# Patient Record
Sex: Male | Born: 1985
Health system: Southern US, Community
[De-identification: ages and names within clinical notes are randomized; demographics above are authoritative.]

## PROBLEM LIST (undated history)

## (undated) HISTORY — PX: OTHER SURGICAL HISTORY: SHX169

---

## 2008-06-03 ENCOUNTER — Emergency Department (HOSPITAL_COMMUNITY): Admission: EM | Admit: 2008-06-03 | Discharge: 2008-06-03 | Payer: Self-pay | Admitting: Emergency Medicine

## 2015-10-24 DIAGNOSIS — Z6830 Body mass index (BMI) 30.0-30.9, adult: Secondary | ICD-10-CM | POA: Diagnosis not present

## 2015-10-24 DIAGNOSIS — E669 Obesity, unspecified: Secondary | ICD-10-CM | POA: Diagnosis not present

## 2015-10-24 DIAGNOSIS — E7889 Other lipoprotein metabolism disorders: Secondary | ICD-10-CM | POA: Diagnosis not present

## 2016-01-02 DIAGNOSIS — R110 Nausea: Secondary | ICD-10-CM | POA: Diagnosis not present

## 2016-01-02 DIAGNOSIS — R51 Headache: Secondary | ICD-10-CM | POA: Diagnosis not present

## 2016-01-02 DIAGNOSIS — S70361A Insect bite (nonvenomous), right thigh, initial encounter: Secondary | ICD-10-CM | POA: Diagnosis not present

## 2016-01-02 DIAGNOSIS — R42 Dizziness and giddiness: Secondary | ICD-10-CM | POA: Diagnosis not present

## 2016-01-02 DIAGNOSIS — R739 Hyperglycemia, unspecified: Secondary | ICD-10-CM | POA: Diagnosis not present

## 2016-01-02 DIAGNOSIS — Z6830 Body mass index (BMI) 30.0-30.9, adult: Secondary | ICD-10-CM | POA: Diagnosis not present

## 2016-01-02 DIAGNOSIS — R509 Fever, unspecified: Secondary | ICD-10-CM | POA: Diagnosis not present

## 2016-01-23 DIAGNOSIS — Z6830 Body mass index (BMI) 30.0-30.9, adult: Secondary | ICD-10-CM | POA: Diagnosis not present

## 2016-01-23 DIAGNOSIS — E669 Obesity, unspecified: Secondary | ICD-10-CM | POA: Diagnosis not present

## 2016-01-23 DIAGNOSIS — Z1389 Encounter for screening for other disorder: Secondary | ICD-10-CM | POA: Diagnosis not present

## 2016-01-23 DIAGNOSIS — E7889 Other lipoprotein metabolism disorders: Secondary | ICD-10-CM | POA: Diagnosis not present

## 2016-01-23 DIAGNOSIS — R197 Diarrhea, unspecified: Secondary | ICD-10-CM | POA: Diagnosis not present

## 2016-03-30 DIAGNOSIS — R1033 Periumbilical pain: Secondary | ICD-10-CM | POA: Diagnosis not present

## 2016-03-30 DIAGNOSIS — R197 Diarrhea, unspecified: Secondary | ICD-10-CM | POA: Diagnosis not present

## 2016-03-30 DIAGNOSIS — Z6830 Body mass index (BMI) 30.0-30.9, adult: Secondary | ICD-10-CM | POA: Diagnosis not present

## 2016-04-02 DIAGNOSIS — R197 Diarrhea, unspecified: Secondary | ICD-10-CM | POA: Diagnosis not present

## 2016-04-02 DIAGNOSIS — R1033 Periumbilical pain: Secondary | ICD-10-CM | POA: Diagnosis not present

## 2016-10-16 DIAGNOSIS — R109 Unspecified abdominal pain: Secondary | ICD-10-CM | POA: Diagnosis not present

## 2016-10-16 DIAGNOSIS — K529 Noninfective gastroenteritis and colitis, unspecified: Secondary | ICD-10-CM | POA: Diagnosis not present

## 2016-10-18 DIAGNOSIS — E669 Obesity, unspecified: Secondary | ICD-10-CM | POA: Diagnosis not present

## 2016-10-18 DIAGNOSIS — K529 Noninfective gastroenteritis and colitis, unspecified: Secondary | ICD-10-CM | POA: Diagnosis not present

## 2016-10-18 DIAGNOSIS — Z6831 Body mass index (BMI) 31.0-31.9, adult: Secondary | ICD-10-CM | POA: Diagnosis not present

## 2016-10-18 DIAGNOSIS — Z7689 Persons encountering health services in other specified circumstances: Secondary | ICD-10-CM | POA: Diagnosis not present

## 2017-01-29 DIAGNOSIS — H179 Unspecified corneal scar and opacity: Secondary | ICD-10-CM | POA: Diagnosis not present

## 2017-02-13 DIAGNOSIS — H179 Unspecified corneal scar and opacity: Secondary | ICD-10-CM | POA: Diagnosis not present

## 2017-03-05 DIAGNOSIS — H219 Unspecified disorder of iris and ciliary body: Secondary | ICD-10-CM | POA: Diagnosis not present

## 2017-03-05 DIAGNOSIS — R69 Illness, unspecified: Secondary | ICD-10-CM | POA: Diagnosis not present

## 2017-03-05 DIAGNOSIS — Z0000 Encounter for general adult medical examination without abnormal findings: Secondary | ICD-10-CM | POA: Diagnosis not present

## 2017-03-05 DIAGNOSIS — Z6828 Body mass index (BMI) 28.0-28.9, adult: Secondary | ICD-10-CM | POA: Diagnosis not present

## 2017-06-11 DIAGNOSIS — R1032 Left lower quadrant pain: Secondary | ICD-10-CM | POA: Diagnosis not present

## 2017-06-11 DIAGNOSIS — E669 Obesity, unspecified: Secondary | ICD-10-CM | POA: Diagnosis not present

## 2017-06-11 DIAGNOSIS — E7889 Other lipoprotein metabolism disorders: Secondary | ICD-10-CM | POA: Diagnosis not present

## 2017-06-19 DIAGNOSIS — H179 Unspecified corneal scar and opacity: Secondary | ICD-10-CM | POA: Diagnosis not present

## 2017-10-17 DIAGNOSIS — H179 Unspecified corneal scar and opacity: Secondary | ICD-10-CM | POA: Diagnosis not present

## 2017-12-12 DIAGNOSIS — G43909 Migraine, unspecified, not intractable, without status migrainosus: Secondary | ICD-10-CM | POA: Diagnosis not present

## 2017-12-12 DIAGNOSIS — R209 Unspecified disturbances of skin sensation: Secondary | ICD-10-CM | POA: Diagnosis not present

## 2017-12-12 DIAGNOSIS — E669 Obesity, unspecified: Secondary | ICD-10-CM | POA: Diagnosis not present

## 2017-12-23 DIAGNOSIS — G43909 Migraine, unspecified, not intractable, without status migrainosus: Secondary | ICD-10-CM | POA: Diagnosis not present

## 2017-12-23 DIAGNOSIS — R202 Paresthesia of skin: Secondary | ICD-10-CM | POA: Diagnosis not present

## 2017-12-30 ENCOUNTER — Emergency Department (HOSPITAL_COMMUNITY)
Admission: EM | Admit: 2017-12-30 | Discharge: 2017-12-30 | Disposition: A | Discharge status: Home / Self Care | Payer: Medicare HMO | Attending: Emergency Medicine | Admitting: Emergency Medicine

## 2017-12-30 ENCOUNTER — Encounter (HOSPITAL_COMMUNITY): Payer: Self-pay | Admitting: Emergency Medicine

## 2017-12-30 ENCOUNTER — Other Ambulatory Visit: Payer: Self-pay

## 2017-12-30 ENCOUNTER — Emergency Department (HOSPITAL_COMMUNITY): Payer: Medicare HMO

## 2017-12-30 DIAGNOSIS — R51 Headache: Secondary | ICD-10-CM | POA: Diagnosis not present

## 2017-12-30 DIAGNOSIS — R519 Headache, unspecified: Secondary | ICD-10-CM

## 2017-12-30 MED ORDER — DEXAMETHASONE 4 MG PO TABS
10.0000 mg | ORAL_TABLET | Freq: Once | ORAL | Status: AC
  Administered 2017-12-30: 10 mg via ORAL
  Filled 2017-12-30: qty 3

## 2017-12-30 MED ORDER — DIPHENHYDRAMINE HCL 50 MG/ML IJ SOLN
50.0000 mg | Freq: Once | INTRAMUSCULAR | Status: AC
  Administered 2017-12-30: 50 mg via INTRAMUSCULAR
  Filled 2017-12-30: qty 1

## 2017-12-30 MED ORDER — PROCHLORPERAZINE EDISYLATE 10 MG/2ML IJ SOLN
10.0000 mg | Freq: Once | INTRAMUSCULAR | Status: AC
  Administered 2017-12-30: 10 mg via INTRAMUSCULAR
  Filled 2017-12-30: qty 2

## 2017-12-30 NOTE — ED Notes (Signed)
E-signature not available, pt verbalized understanding of DC instructions  

## 2017-12-30 NOTE — ED Triage Notes (Addendum)
Pt states headaches for 3 weeks that change location,. Pt also states he has muscle pains to his legs and arms. Pt had an MRI last Monday and it did not show anything new.

## 2017-12-30 NOTE — ED Provider Notes (Signed)
Patient placed in Quick Look pathway, seen and evaluated   Chief Complaint: headache  HPI:   Brian Huber is a 32 y.o. male who presents to the ED with a headache that started 3 weeks ago. The headache is located on both sides of the head and back of the head. Patient reports having headaches 2 years ago and had an MRI and they told them it may be TIA's. Patient was adopted at age 15 and family hx is not known.   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  Physical Exam:  BP (!) 114/97 (BP Location: Right Arm)   Pulse 79   Temp 98.4 F (36.9 C) (Oral)   Resp 14   SpO2 100%    Gen: No distress  Neuro: grips are equal no pronator drift  Skin: Warm and dry    Initiation of care has begun. The patient has been counseled on the process, plan, and necessity for staying for the completion/evaluation, and the remainder of the medical screening examination    Ashley Murrain, NP 12/30/17 [place], [place], DO 12/30/17 [id number]

## 2017-12-30 NOTE — ED Provider Notes (Signed)
[facility] EMERGENCY DEPARTMENT Provider Note   CSN: [id number] Arrival date & time: 12/30/17  [place]     History   Chief Complaint Chief Complaint  Patient presents with  . Headache    HPI Brian Huber is a 32 y.o. male.  32 yo M with a chief complaint of a headache.  This is diffusely about the head going on for the past 3 weeks.  Nothing seems to make it better or worse.  Denies trauma.  He states that his legs feel heavy from time to time and sometimes his left arm feels heavy.  He denies numbness or weakness.  Denies neck pain or back pain.  The history is provided by the patient.  Headache   This is a new problem. The current episode started yesterday. The problem occurs constantly. The problem has not changed since onset.The headache is associated with nothing. Pain location: whole head. The quality of the pain is described as dull. The pain is at a severity of 7/10. The pain is moderate. Pertinent negatives include no fever, no palpitations, no shortness of breath and no vomiting. He has tried nothing for the symptoms. The treatment provided no relief.    History reviewed. No pertinent past medical history.  There are no active problems to display for this patient.   Past Surgical History:  Procedure Laterality Date  . ankle injury          Home Medications    Prior to Admission medications   Not on File    Family History No family history on file.  Social History Social History   Tobacco Use  . Smoking status: Not on file  Substance Use Topics  . Alcohol use: Not on file  . Drug use: Not on file     Allergies   Patient has no known allergies.   Review of Systems Review of Systems  Constitutional: Negative for chills and fever.  HENT: Negative for congestion and facial swelling.   Eyes: Negative for discharge and visual disturbance.  Respiratory: Negative for shortness of breath.   Cardiovascular: Negative for chest pain  and palpitations.  Gastrointestinal: Negative for abdominal pain, diarrhea and vomiting.  Musculoskeletal: Negative for arthralgias and myalgias.  Skin: Negative for color change and rash.  Neurological: Positive for headaches. Negative for tremors and syncope.  Psychiatric/Behavioral: Negative for confusion and dysphoric mood.     Physical Exam Updated Vital Signs BP 110/76   Pulse 64   Temp 98.4 F (36.9 C) (Oral)   Resp 14   SpO2 98%   Physical Exam  Constitutional: He is oriented to person, place, and time. He appears well-developed and well-nourished.  HENT:  Head: Normocephalic and atraumatic.  Eyes: Pupils are equal, round, and reactive to light. EOM are normal.  Neck: Normal range of motion. Neck supple. No JVD present.  Cardiovascular: Normal rate and regular rhythm. Exam reveals no gallop and no friction rub.  No murmur heard. Pulmonary/Chest: No respiratory distress. He has no wheezes.  Abdominal: He exhibits no distension. There is no rebound and no guarding.  Musculoskeletal: Normal range of motion.  Neurological: He is alert and oriented to person, place, and time. He has normal strength. He displays no atrophy. No cranial nerve deficit or sensory deficit. He displays a negative Romberg sign. Coordination and gait normal.  Skin: No rash noted. No pallor.  Psychiatric: He has a normal mood and affect. His behavior is normal.  Nursing note and vitals reviewed.  ED Treatments / Results  Labs (all labs ordered are listed, but only abnormal results are displayed) Labs Reviewed - No data to display  EKG None  Radiology Ct Head Wo Contrast  Result Date: 12/30/2017 CLINICAL DATA:  Headache for the last 3 weeks, progressively worsening. Denies injury. EXAM: CT HEAD WITHOUT CONTRAST TECHNIQUE: Contiguous axial images were obtained from the base of the skull through the vertex without intravenous contrast. COMPARISON:  Brain MRI dated 12/23/2017. FINDINGS: Brain:  Ventricles are normal in size and configuration. All areas of the brain demonstrate appropriate gray-white matter differentiation. There is no mass, hemorrhage, edema or other evidence of acute parenchymal abnormality. No extra-axial hemorrhage. Vascular: No hyperdense vessel or unexpected calcification. Skull: Normal. Negative for fracture or focal lesion. Sinuses/Orbits: No acute finding. Other: None. IMPRESSION: Normal head CT.  No intracranial mass, hemorrhage or edema. Electronically Signed   By: Franki Cabot M.D.   On: 12/30/2017 18:47    Procedures Procedures (including critical care time)  Medications Ordered in ED Medications  prochlorperazine (COMPAZINE) injection 10 mg (10 mg Intramuscular Given 12/30/17 2038)  diphenhydrAMINE (BENADRYL) injection 50 mg (50 mg Intramuscular Given 12/30/17 2038)  dexamethasone (DECADRON) tablet 10 mg (10 mg Oral Given 12/30/17 2037)     Initial Impression / Assessment and Plan / ED Course  I have reviewed the triage vital signs and the nursing notes.  Pertinent labs & imaging results that were available during my care of the patient were reviewed by me and considered in my medical decision making (see chart for details).     32 yo M with a chief complaint of a headache.  Benign neurologic exam.  Patient apparently has had 2 MRIs in the past couple weeks.  Family is unsure of the results.  CT here is negative.  Symptoms improved significantly with a headache cocktail.  We will have him follow-up with neurology.  Was able to access the MRIs through PACS, he has some chronic appearing white matter changes that they think are due to prior injury.  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  11:42 PM:  I have discussed the diagnosis/risks/treatment options with the patient and family and believe the pt to be eligible for discharge home to follow-up with Neuro . We also discussed returning to the ED  immediately if new or worsening sx occur. We discussed the sx which are most concerning (e.g., worsening headache, new weakness ) that necessitate immediate return. Medications administered to the patient during their visit and any new prescriptions provided to the patient are listed below.  Medications given during this visit Medications  prochlorperazine (COMPAZINE) injection 10 mg (10 mg Intramuscular Given 12/30/17 2038)  diphenhydrAMINE (BENADRYL) injection 50 mg (50 mg Intramuscular Given 12/30/17 2038)  dexamethasone (DECADRON) tablet 10 mg (10 mg Oral Given 12/30/17 2037)      The patient appears reasonably screen and/or stabilized for discharge and I doubt any other medical condition or other [facility] requiring further screening, evaluation, or treatment in the ED at this time prior to discharge.    Final Clinical Impressions(s) / ED Diagnoses   Final diagnoses:  Bad headache    ED Discharge Orders        Ordered    Ambulatory referral to Neurology    Comments:  New headache syndrome   12/30/17 [place], [place], DO 12/30/17 [id number]

## 2017-12-31 ENCOUNTER — Encounter: Payer: Self-pay | Admitting: Neurology

## 2018-02-17 DIAGNOSIS — H179 Unspecified corneal scar and opacity: Secondary | ICD-10-CM | POA: Diagnosis not present

## 2018-03-05 NOTE — Progress Notes (Signed)
NEUROLOGY CONSULTATION NOTE  Brian Huber MRN: [id number] DOB: 1985/12/07  Referring provider: Deno Etienne, DO (ED referral) Primary care provider: Jeanie Sewer, NP  Reason for consult:  headache  HISTORY OF PRESENT ILLNESS: Brian Huber is a 32 year old right-handed male who presents for headache.  He is accompanied by his father who supplements history.  Onset:  2017.  An MRI of brain without contrast from 01/02/16 was personally reviewed and demonstrated small white matter signal abnormality in the right posterior frontal region, suspicious for either old infarct or prior trauma.   He was started on ASA 81mg  daily.  Headaches improved.   In June 2019, headaches returned.  He had a repeat MRI of brain with and without contrast on 12/18/17, which was personally reviewed and still demonstrated the small focus in the right frontal region, unchanged. Location:  diffuse Quality:  Pounding, squeezing Intensity:  Severe.  He denies new headache Aura:  no Prodrome:  no Postdrome:  no Associated symptoms:  phonophobia.  He denies associated nausea, vomiting, photophobia, autonomic symptoms, unilateral numbness or weakness. Duration:  1 day Frequency:  daily Frequency of abortive medication: no Exacerbating factors:  no Relieving factors:  no Activity:  No  At the same time, he reports onset of shaking.  It is a sensation of internal shaking involving the entire body or a particular limb (any).  There is no visible shaking.  If it occurs in his leg, his leg will feel weak.  In addition, he reports feeling nauseous after eating.  Marland Kitchen  He was evaluated in the ED on 12/30/17.  CT of head personally reviewed and was unremarkable.  He was treated with a headache cocktail.  Current NSAIDS:  ASA 81mg  daily (for "mini strokes") Current analgesics:  no Current triptans:  no Current ergotamine:  no Current anti-emetic:  no Current muscle relaxants:  no Current anti-anxiolytic:  no Current  sleep aide:  no Current Antihypertensive medications:  no Current Antidepressant medications:  no Current Anticonvulsant medications:  no Current anti-CGRP:  no Current Vitamins/Herbal/Supplements:  no Current Antihistamines/Decongestants:  no Other therapy:  no Other medication:  no  He reportedly had a repeat MRI of the brain  Past NSAIDS:  no Past analgesics:  Goody powder Past abortive triptans:  no Past abortive ergotamine:  no Past muscle relaxants:  no Past anti-emetic:  no Past antihypertensive medications:  no Past antidepressant medications:  no Past anticonvulsant medications:  Gabapentin (ineffective) Past anti-CGRP:  no Past vitamins/Herbal/Supplements:  no Past antihistamines/decongestants:  no Other past therapies:  no  Caffeine:  Tea and cola, Mt Dew daily Alcohol:  no Smoker:  no Diet:  A lot of water.  Soda daily Exercise:  Physically active Depression:  no; Anxiety:  no Other pain:  no Sleep hygiene:  [place] Prior to 2016, no history of headache.  Denies history of head trauma. Family history of headache:  adopted   PAST MEDICAL HISTORY: No PMH  PAST SURGICAL HISTORY: Past Surgical History:  Procedure Laterality Date  . ankle injury      MEDICATIONS: No current outpatient medications on file prior to visit.   No current facility-administered medications on file prior to visit.     ALLERGIES: No Known Allergies  FAMILY HISTORY: No family history on file.  SOCIAL HISTORY: Social History   Socioeconomic History  . Marital status: Single    Spouse name: Not on file  . Number of children: Not on file  . Years of education: Not  on file  . Highest education level: Not on file  Occupational History  . Not on file  Social Needs  . Financial resource strain: Not on file  . Food insecurity:    Worry: Not on file    Inability: Not on file  . Transportation needs:    Medical: Not on file    Non-medical: Not on file  Tobacco Use  .  Smoking status: Not on file  Substance and Sexual Activity  . Alcohol use: Not on file  . Drug use: Not on file  . Sexual activity: Not on file  Lifestyle  . Physical activity:    Days per week: Not on file    Minutes per session: Not on file  . Stress: Not on file  Relationships  . Social connections:    Talks on phone: Not on file    Gets together: Not on file    Attends religious service: Not on file    Active member of club or organization: Not on file    Attends meetings of clubs or organizations: Not on file    Relationship status: Not on file  . Intimate partner violence:    Fear of current or ex partner: Not on file    Emotionally abused: Not on file    Physically abused: Not on file    Forced sexual activity: Not on file  Other Topics Concern  . Not on file  Social History Narrative  . Not on file    REVIEW OF SYSTEMS: Constitutional: No fevers, chills, or sweats, no generalized fatigue, change in appetite Eyes: No visual changes, double vision, eye pain Ear, nose and throat: No hearing loss, ear pain, nasal congestion, sore throat Cardiovascular: No chest pain, palpitations Respiratory:  No shortness of breath at rest or with exertion, wheezes GastrointestinaI: No nausea, vomiting, diarrhea, abdominal pain, fecal incontinence Genitourinary:  No dysuria, urinary retention or frequency Musculoskeletal:  No neck pain, back pain Integumentary: No rash, pruritus, skin lesions Neurological: as above Psychiatric: No depression, insomnia, anxiety Endocrine: No palpitations, fatigue, diaphoresis, mood swings, change in appetite, change in weight, increased thirst Hematologic/Lymphatic:  No purpura, petechiae. Allergic/Immunologic: no itchy/runny eyes, nasal congestion, recent allergic reactions, rashes  PHYSICAL EXAM: Blood pressure 108/88, pulse 69, height 5' 10.75" (1.797 m), weight 223 lb (101.2 kg), SpO2 99 %. General: No acute distress.  Head:   Normocephalic/atraumatic Eyes:  fundi examined but not visualized Neck: supple, no paraspinal tenderness, full range of motion Back: No paraspinal tenderness Heart: regular rate and rhythm Lungs: Clear to auscultation bilaterally. Vascular: No carotid bruits. Neurological Exam: Mental status: alert and oriented to person, place, and time, recent and remote memory intact, fund of knowledge intact, attention and concentration intact, speech fluent and not dysarthric, language intact. Cranial nerves: CN I: not tested CN II: pupils equal, round and reactive to light, visual fields intact CN III, IV, VI:  full range of motion, no nystagmus, no ptosis CN V: facial sensation intact CN VII: upper and lower face symmetric CN VIII: hearing intact CN IX, X: gag intact, uvula midline CN XI: sternocleidomastoid and trapezius muscles intact CN XII: tongue midline Bulk & Tone: normal, no fasciculations. Motor:  5/5 throughout  Sensation: temperature and vibration sensation intact. Deep Tendon Reflexes:  2+ throughout, toes downgoing.  Finger to nose testing:  Without dysmetria.  Heel to shin:  Without dysmetria.  Gait:  Normal station and stride.  Able to turn and tandem walk. Romberg negative Reports feeling shaking  now.  No tremor or abnormal movement appreciated.Marland Kitchen  IMPRESSION: 1.  Daily headaches, unspecified.  Migraine possible 2.  Abnormal white matter change on brain MRI.  Unclear.  Perhaps remote tiny infarct, trauma (denies history), not likely demyelinating disease (stable compared to prior imaging in 2017). 3.  Internal sensation of shaking.  No shaking appreciated.  I do not suspect seizure.  Not associated with headache.  Likely not neurologic 4.  Nausea after he eats.  Not associated with headache.    PLAN: 1.  Start nortriptyline 10mg  at bedtime.   If headaches not improved in 4 weeks, contact me and we can increase dose. 2.  Take Tylenol or Excedrin Tension for acute headaches but  limit to no more than 2 days out of week to prevent rebound headache 3.  Keep headache diary 4.  Stop drinking caffeine.  Continue drinking plenty of water 5. Follow up in 4 months.  Thank you for allowing me to take part in the care of this patient.  Metta Clines, DO  CC: Jeanie Sewer, NP

## 2018-03-06 ENCOUNTER — Encounter: Payer: Self-pay | Admitting: Neurology

## 2018-03-06 ENCOUNTER — Encounter

## 2018-03-06 ENCOUNTER — Ambulatory Visit (INDEPENDENT_AMBULATORY_CARE_PROVIDER_SITE_OTHER): Payer: Medicare HMO | Admitting: Neurology

## 2018-03-06 VITALS — BP 108/88 | HR 69 | Ht 70.75 in | Wt 223.0 lb

## 2018-03-06 DIAGNOSIS — R110 Nausea: Secondary | ICD-10-CM

## 2018-03-06 DIAGNOSIS — R51 Headache: Secondary | ICD-10-CM | POA: Diagnosis not present

## 2018-03-06 DIAGNOSIS — R519 Headache, unspecified: Secondary | ICD-10-CM

## 2018-03-06 DIAGNOSIS — R251 Tremor, unspecified: Secondary | ICD-10-CM | POA: Diagnosis not present

## 2018-03-06 MED ORDER — NORTRIPTYLINE HCL 10 MG PO CAPS: 10.0000 mg | ORAL_CAPSULE | Freq: Every day | ORAL | 3 refills | Status: DC

## 2018-03-06 NOTE — Patient Instructions (Addendum)
Headaches may be migraine The spot on the brain is unclear.  It may be an old tiny stroke or evidence of old head trauma  1.  Start nortriptyline 10mg  at bedtime.  If headaches not improved in 4 weeks, contact me and we can increase dose. 2.  Take Tylenol or Excedrin Tension for acute headaches but limit to no more than 2 days out of week to prevent rebound headache 3.  Keep headache diary 4.  Stop drinking caffeine.  Continue drinking plenty of water 5. Follow up in 4 months.

## 2018-05-14 DIAGNOSIS — R2681 Unsteadiness on feet: Secondary | ICD-10-CM | POA: Diagnosis not present

## 2018-05-14 DIAGNOSIS — E785 Hyperlipidemia, unspecified: Secondary | ICD-10-CM | POA: Diagnosis not present

## 2018-05-14 DIAGNOSIS — Z1331 Encounter for screening for depression: Secondary | ICD-10-CM | POA: Diagnosis not present

## 2018-05-14 DIAGNOSIS — Z1339 Encounter for screening examination for other mental health and behavioral disorders: Secondary | ICD-10-CM | POA: Diagnosis not present

## 2018-05-14 DIAGNOSIS — R51 Headache: Secondary | ICD-10-CM | POA: Diagnosis not present

## 2018-05-14 DIAGNOSIS — R030 Elevated blood-pressure reading, without diagnosis of hypertension: Secondary | ICD-10-CM | POA: Diagnosis not present

## 2018-05-14 DIAGNOSIS — S70361A Insect bite (nonvenomous), right thigh, initial encounter: Secondary | ICD-10-CM | POA: Diagnosis not present

## 2018-05-14 DIAGNOSIS — R5382 Chronic fatigue, unspecified: Secondary | ICD-10-CM | POA: Diagnosis not present

## 2018-05-27 DIAGNOSIS — R2681 Unsteadiness on feet: Secondary | ICD-10-CM | POA: Diagnosis not present

## 2018-05-27 DIAGNOSIS — R030 Elevated blood-pressure reading, without diagnosis of hypertension: Secondary | ICD-10-CM | POA: Diagnosis not present

## 2018-05-27 DIAGNOSIS — E785 Hyperlipidemia, unspecified: Secondary | ICD-10-CM | POA: Diagnosis not present

## 2018-05-27 DIAGNOSIS — Z6832 Body mass index (BMI) 32.0-32.9, adult: Secondary | ICD-10-CM | POA: Diagnosis not present

## 2018-05-27 DIAGNOSIS — A770 Spotted fever due to Rickettsia rickettsii: Secondary | ICD-10-CM | POA: Diagnosis not present

## 2018-05-27 DIAGNOSIS — R51 Headache: Secondary | ICD-10-CM | POA: Diagnosis not present

## 2018-07-10 DIAGNOSIS — R030 Elevated blood-pressure reading, without diagnosis of hypertension: Secondary | ICD-10-CM | POA: Diagnosis not present

## 2018-07-10 DIAGNOSIS — E785 Hyperlipidemia, unspecified: Secondary | ICD-10-CM | POA: Diagnosis not present

## 2018-07-10 DIAGNOSIS — Z6832 Body mass index (BMI) 32.0-32.9, adult: Secondary | ICD-10-CM | POA: Diagnosis not present

## 2018-07-10 DIAGNOSIS — R2681 Unsteadiness on feet: Secondary | ICD-10-CM | POA: Diagnosis not present

## 2018-07-10 DIAGNOSIS — R51 Headache: Secondary | ICD-10-CM | POA: Diagnosis not present

## 2018-07-17 ENCOUNTER — Ambulatory Visit: Payer: Medicare HMO | Admitting: Neurology

## 2018-07-29 DIAGNOSIS — R030 Elevated blood-pressure reading, without diagnosis of hypertension: Secondary | ICD-10-CM | POA: Diagnosis not present

## 2018-07-29 DIAGNOSIS — R945 Abnormal results of liver function studies: Secondary | ICD-10-CM | POA: Diagnosis not present

## 2018-07-29 DIAGNOSIS — R2681 Unsteadiness on feet: Secondary | ICD-10-CM | POA: Diagnosis not present

## 2018-07-29 DIAGNOSIS — E669 Obesity, unspecified: Secondary | ICD-10-CM | POA: Diagnosis not present

## 2018-07-29 DIAGNOSIS — Z6833 Body mass index (BMI) 33.0-33.9, adult: Secondary | ICD-10-CM | POA: Diagnosis not present

## 2018-07-29 DIAGNOSIS — R51 Headache: Secondary | ICD-10-CM | POA: Diagnosis not present

## 2018-07-29 DIAGNOSIS — E785 Hyperlipidemia, unspecified: Secondary | ICD-10-CM | POA: Diagnosis not present

## 2018-09-02 DIAGNOSIS — R1032 Left lower quadrant pain: Secondary | ICD-10-CM | POA: Diagnosis not present

## 2018-09-02 DIAGNOSIS — R197 Diarrhea, unspecified: Secondary | ICD-10-CM | POA: Diagnosis not present

## 2018-09-03 DIAGNOSIS — R197 Diarrhea, unspecified: Secondary | ICD-10-CM | POA: Diagnosis not present

## 2018-10-02 DIAGNOSIS — R197 Diarrhea, unspecified: Secondary | ICD-10-CM | POA: Diagnosis not present

## 2018-12-30 DIAGNOSIS — R197 Diarrhea, unspecified: Secondary | ICD-10-CM | POA: Diagnosis not present

## 2018-12-30 DIAGNOSIS — R1032 Left lower quadrant pain: Secondary | ICD-10-CM | POA: Diagnosis not present

## 2019-01-02 DIAGNOSIS — D7389 Other diseases of spleen: Secondary | ICD-10-CM | POA: Diagnosis not present

## 2019-01-02 DIAGNOSIS — K760 Fatty (change of) liver, not elsewhere classified: Secondary | ICD-10-CM | POA: Diagnosis not present

## 2019-01-02 DIAGNOSIS — D1803 Hemangioma of intra-abdominal structures: Secondary | ICD-10-CM | POA: Diagnosis not present

## 2019-01-02 DIAGNOSIS — R1032 Left lower quadrant pain: Secondary | ICD-10-CM | POA: Diagnosis not present

## 2019-01-02 DIAGNOSIS — R590 Localized enlarged lymph nodes: Secondary | ICD-10-CM | POA: Diagnosis not present

## 2019-01-12 DIAGNOSIS — K760 Fatty (change of) liver, not elsewhere classified: Secondary | ICD-10-CM | POA: Insufficient documentation

## 2019-01-12 DIAGNOSIS — R591 Generalized enlarged lymph nodes: Secondary | ICD-10-CM | POA: Insufficient documentation

## 2019-01-15 DIAGNOSIS — R197 Diarrhea, unspecified: Secondary | ICD-10-CM | POA: Diagnosis not present

## 2019-01-15 DIAGNOSIS — R1032 Left lower quadrant pain: Secondary | ICD-10-CM | POA: Diagnosis not present

## 2019-01-21 DIAGNOSIS — R1032 Left lower quadrant pain: Secondary | ICD-10-CM | POA: Diagnosis not present

## 2019-01-21 DIAGNOSIS — R197 Diarrhea, unspecified: Secondary | ICD-10-CM | POA: Diagnosis not present

## 2019-01-21 DIAGNOSIS — K599 Functional intestinal disorder, unspecified: Secondary | ICD-10-CM | POA: Diagnosis not present

## 2019-01-26 DIAGNOSIS — E785 Hyperlipidemia, unspecified: Secondary | ICD-10-CM | POA: Diagnosis not present

## 2019-01-26 DIAGNOSIS — Z6833 Body mass index (BMI) 33.0-33.9, adult: Secondary | ICD-10-CM | POA: Diagnosis not present

## 2019-01-26 DIAGNOSIS — E669 Obesity, unspecified: Secondary | ICD-10-CM | POA: Diagnosis not present

## 2019-01-26 DIAGNOSIS — R591 Generalized enlarged lymph nodes: Secondary | ICD-10-CM | POA: Diagnosis not present

## 2019-01-26 DIAGNOSIS — R2681 Unsteadiness on feet: Secondary | ICD-10-CM | POA: Diagnosis not present

## 2019-01-26 DIAGNOSIS — R51 Headache: Secondary | ICD-10-CM | POA: Diagnosis not present

## 2019-02-05 DIAGNOSIS — R161 Splenomegaly, not elsewhere classified: Secondary | ICD-10-CM | POA: Diagnosis not present

## 2019-02-05 DIAGNOSIS — R591 Generalized enlarged lymph nodes: Secondary | ICD-10-CM | POA: Diagnosis not present

## 2019-02-12 DIAGNOSIS — K580 Irritable bowel syndrome with diarrhea: Secondary | ICD-10-CM | POA: Diagnosis not present

## 2019-02-12 DIAGNOSIS — R760 Raised antibody titer: Secondary | ICD-10-CM | POA: Insufficient documentation

## 2019-02-18 DIAGNOSIS — R918 Other nonspecific abnormal finding of lung field: Secondary | ICD-10-CM | POA: Diagnosis not present

## 2019-02-18 DIAGNOSIS — R591 Generalized enlarged lymph nodes: Secondary | ICD-10-CM | POA: Diagnosis not present

## 2019-02-18 DIAGNOSIS — R590 Localized enlarged lymph nodes: Secondary | ICD-10-CM | POA: Diagnosis not present

## 2019-02-19 DIAGNOSIS — R599 Enlarged lymph nodes, unspecified: Secondary | ICD-10-CM | POA: Diagnosis not present

## 2019-02-19 DIAGNOSIS — R161 Splenomegaly, not elsewhere classified: Secondary | ICD-10-CM | POA: Diagnosis not present

## 2019-02-19 DIAGNOSIS — R591 Generalized enlarged lymph nodes: Secondary | ICD-10-CM | POA: Diagnosis not present

## 2019-02-25 DIAGNOSIS — R918 Other nonspecific abnormal finding of lung field: Secondary | ICD-10-CM | POA: Diagnosis not present

## 2019-02-25 DIAGNOSIS — R599 Enlarged lymph nodes, unspecified: Secondary | ICD-10-CM | POA: Diagnosis not present

## 2019-03-05 DIAGNOSIS — R590 Localized enlarged lymph nodes: Secondary | ICD-10-CM | POA: Diagnosis not present

## 2019-03-13 ENCOUNTER — Other Ambulatory Visit (HOSPITAL_COMMUNITY)
Admission: RE | Admit: 2019-03-13 | Discharge: 2019-03-13 | Disposition: A | Discharge status: Home / Self Care | Payer: Medicare HMO | Source: Ambulatory Visit | Attending: Pathology | Admitting: Pathology

## 2019-03-13 DIAGNOSIS — Z8619 Personal history of other infectious and parasitic diseases: Secondary | ICD-10-CM | POA: Diagnosis not present

## 2019-03-13 DIAGNOSIS — E7800 Pure hypercholesterolemia, unspecified: Secondary | ICD-10-CM | POA: Diagnosis not present

## 2019-03-13 DIAGNOSIS — Z8673 Personal history of transient ischemic attack (TIA), and cerebral infarction without residual deficits: Secondary | ICD-10-CM | POA: Diagnosis not present

## 2019-03-13 DIAGNOSIS — R590 Localized enlarged lymph nodes: Secondary | ICD-10-CM | POA: Diagnosis not present

## 2019-03-18 DIAGNOSIS — R161 Splenomegaly, not elsewhere classified: Secondary | ICD-10-CM | POA: Diagnosis not present

## 2019-03-18 DIAGNOSIS — R591 Generalized enlarged lymph nodes: Secondary | ICD-10-CM | POA: Diagnosis not present

## 2019-03-19 DIAGNOSIS — R591 Generalized enlarged lymph nodes: Secondary | ICD-10-CM | POA: Diagnosis not present

## 2019-03-19 DIAGNOSIS — R51 Headache: Secondary | ICD-10-CM | POA: Diagnosis not present

## 2019-03-25 DIAGNOSIS — R300 Dysuria: Secondary | ICD-10-CM | POA: Diagnosis not present

## 2019-03-25 DIAGNOSIS — R1032 Left lower quadrant pain: Secondary | ICD-10-CM | POA: Diagnosis not present

## 2019-03-30 DIAGNOSIS — R300 Dysuria: Secondary | ICD-10-CM | POA: Diagnosis not present

## 2019-03-30 DIAGNOSIS — R591 Generalized enlarged lymph nodes: Secondary | ICD-10-CM | POA: Diagnosis not present

## 2019-04-22 DIAGNOSIS — C3432 Malignant neoplasm of lower lobe, left bronchus or lung: Secondary | ICD-10-CM | POA: Diagnosis not present

## 2019-05-11 DIAGNOSIS — R519 Headache, unspecified: Secondary | ICD-10-CM | POA: Diagnosis not present

## 2019-05-11 DIAGNOSIS — R2681 Unsteadiness on feet: Secondary | ICD-10-CM | POA: Diagnosis not present

## 2019-05-11 DIAGNOSIS — Z6834 Body mass index (BMI) 34.0-34.9, adult: Secondary | ICD-10-CM | POA: Diagnosis not present

## 2019-05-11 DIAGNOSIS — E785 Hyperlipidemia, unspecified: Secondary | ICD-10-CM | POA: Diagnosis not present

## 2019-05-11 DIAGNOSIS — R591 Generalized enlarged lymph nodes: Secondary | ICD-10-CM | POA: Diagnosis not present

## 2019-05-11 DIAGNOSIS — E669 Obesity, unspecified: Secondary | ICD-10-CM | POA: Diagnosis not present

## 2019-06-10 DIAGNOSIS — G8929 Other chronic pain: Secondary | ICD-10-CM | POA: Diagnosis not present

## 2019-06-10 DIAGNOSIS — R2681 Unsteadiness on feet: Secondary | ICD-10-CM | POA: Diagnosis not present

## 2019-06-10 DIAGNOSIS — R591 Generalized enlarged lymph nodes: Secondary | ICD-10-CM | POA: Diagnosis not present

## 2019-06-10 DIAGNOSIS — E669 Obesity, unspecified: Secondary | ICD-10-CM | POA: Diagnosis not present

## 2019-06-10 DIAGNOSIS — M791 Myalgia, unspecified site: Secondary | ICD-10-CM | POA: Diagnosis not present

## 2019-06-10 DIAGNOSIS — Z6834 Body mass index (BMI) 34.0-34.9, adult: Secondary | ICD-10-CM | POA: Diagnosis not present

## 2019-06-10 DIAGNOSIS — E785 Hyperlipidemia, unspecified: Secondary | ICD-10-CM | POA: Diagnosis not present

## 2019-06-10 DIAGNOSIS — Z7689 Persons encountering health services in other specified circumstances: Secondary | ICD-10-CM | POA: Diagnosis not present

## 2019-06-20 IMAGING — CT CT HEAD W/O CM
4 series · 16 of 47 positions shown, 18 images · non-contrast
Comparison: Brain MRI dated 12/23/2017.

CLINICAL DATA: Headache for the last 3 weeks, progressively
worsening. Denies injury.

EXAM:
CT HEAD WITHOUT CONTRAST
TECHNIQUE: Contiguous axial images were obtained from the base of the skull
through the vertex without intravenous contrast.

[Series 3: head wo · axial · 0.45mm/px · z∈[-177,-67]mm · 7 of 30 slices shown, 9 images]
[im 4/30  brain]
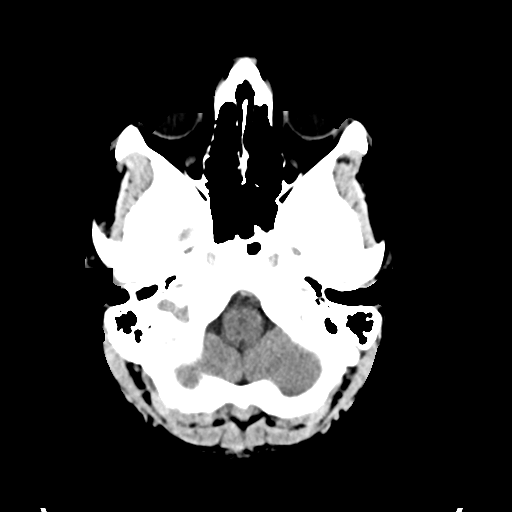
[im 4/30  bone]
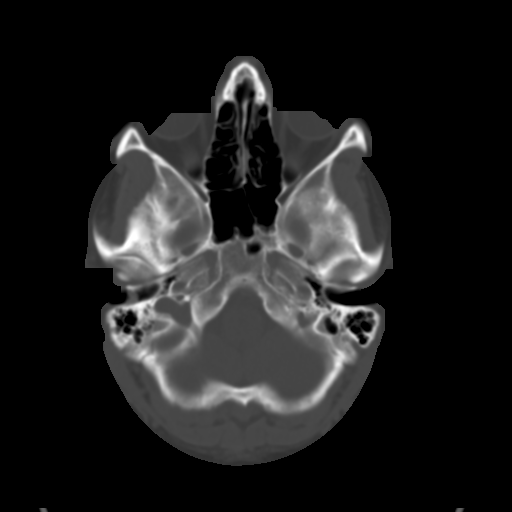
[im 8/30  brain]
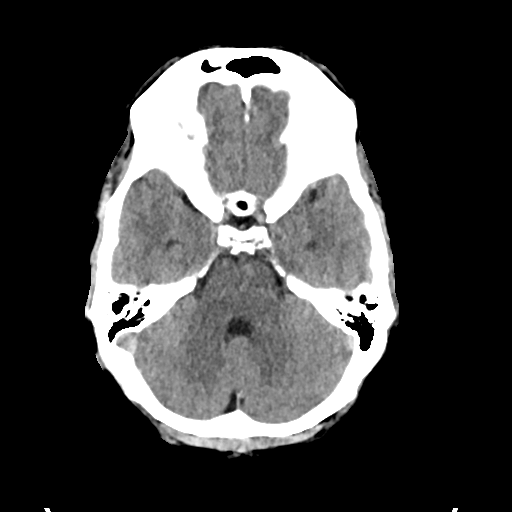
[im 11/30  brain]
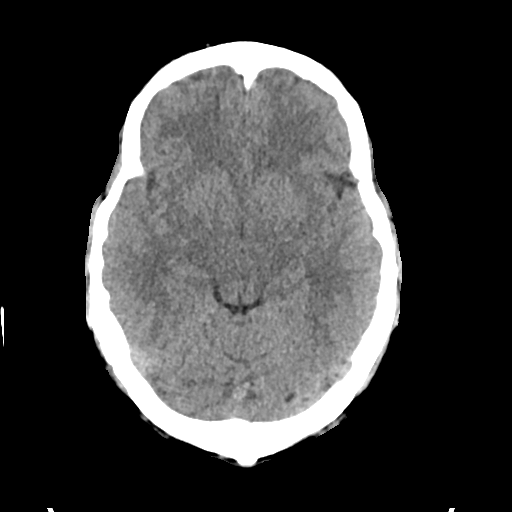
[im 15/30  brain]
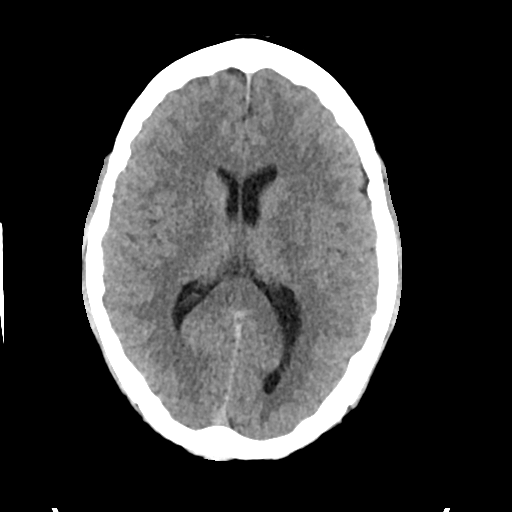
[im 19/30  brain]
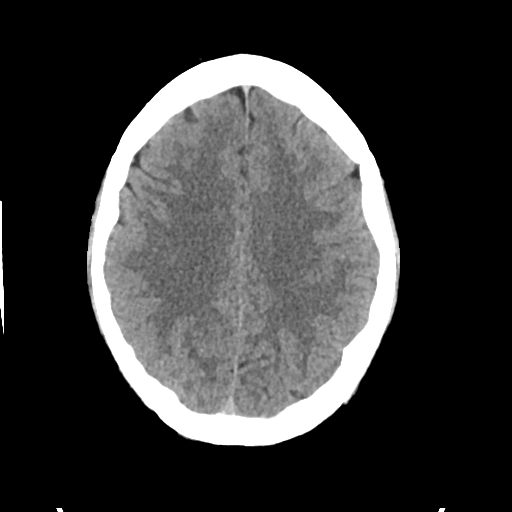
[im 19/30  bone]
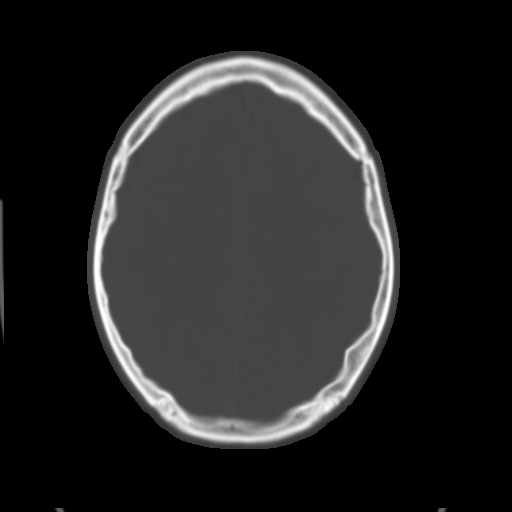
[im 22/30  brain]
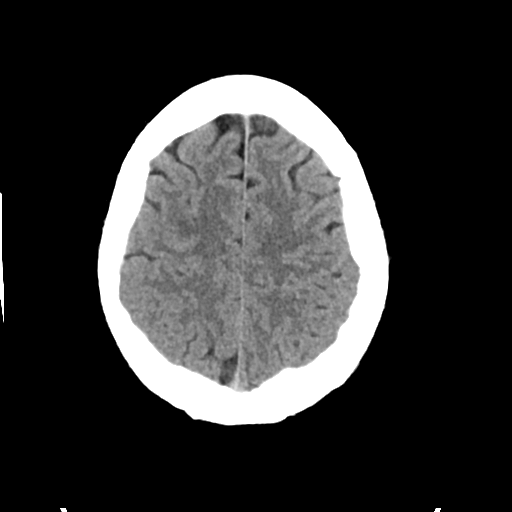
[im 26/30  brain]
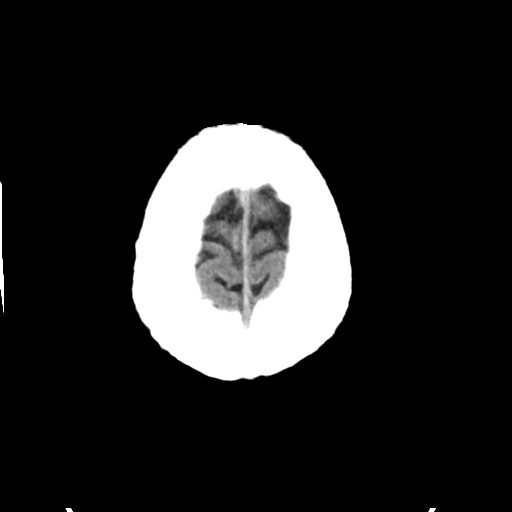

[Series 4: head bone · axial · 0.45mm/px · z∈[-178,-148]mm · 3 of 75 slices shown]
[im 8/75  bone]
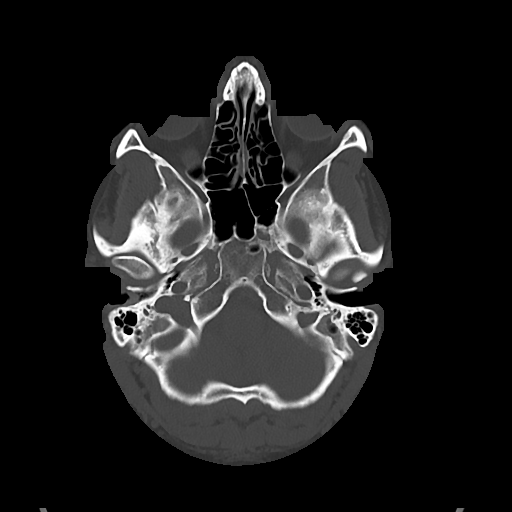
[im 15/75  bone]
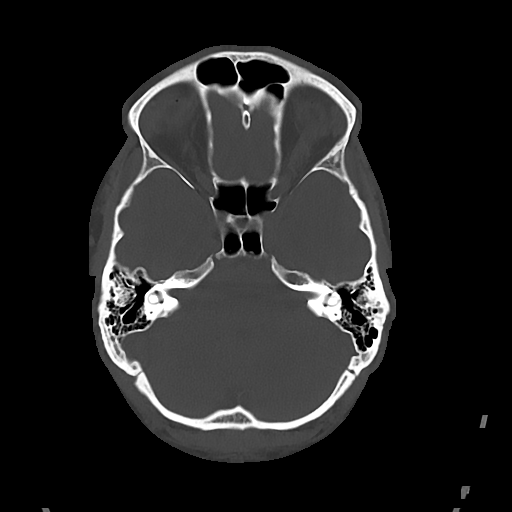
[im 23/75  bone]
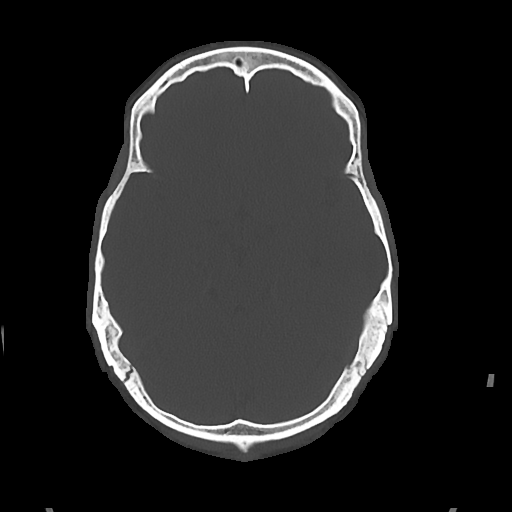

[Series 5: cor soft · coronal · 0.33mm/px · 3 of 64 slices shown]
[im 22/64  brain]
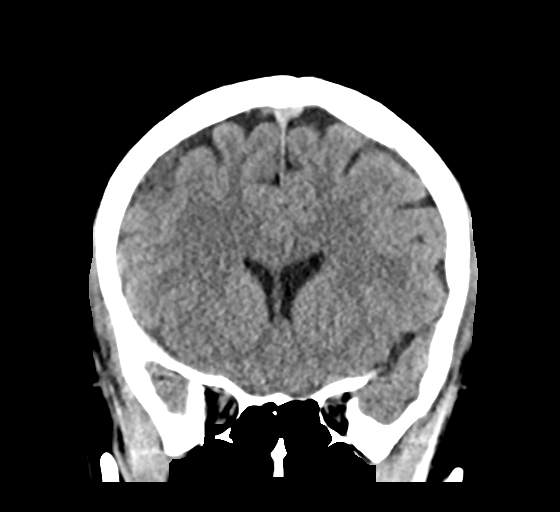
[im 29/64  brain]
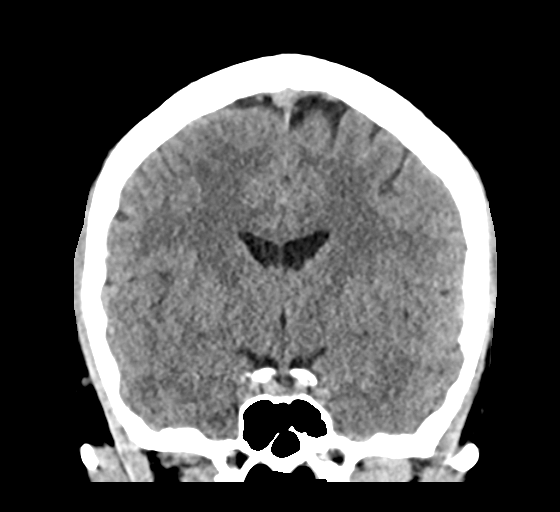
[im 36/64  brain]
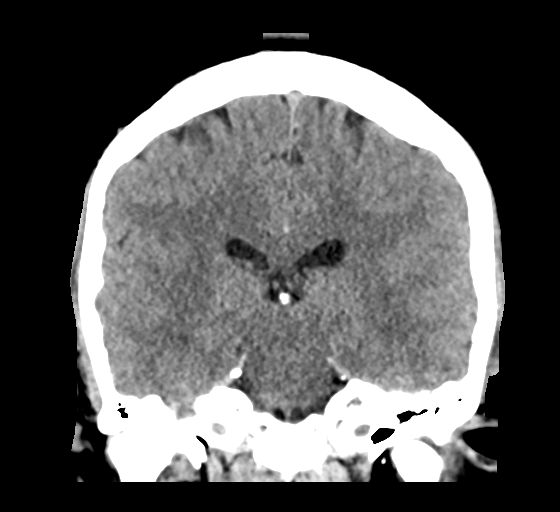

[Series 6: sag soft · sagittal · 0.31mm/px · 3 of 51 slices shown]
[im 17/51  brain]
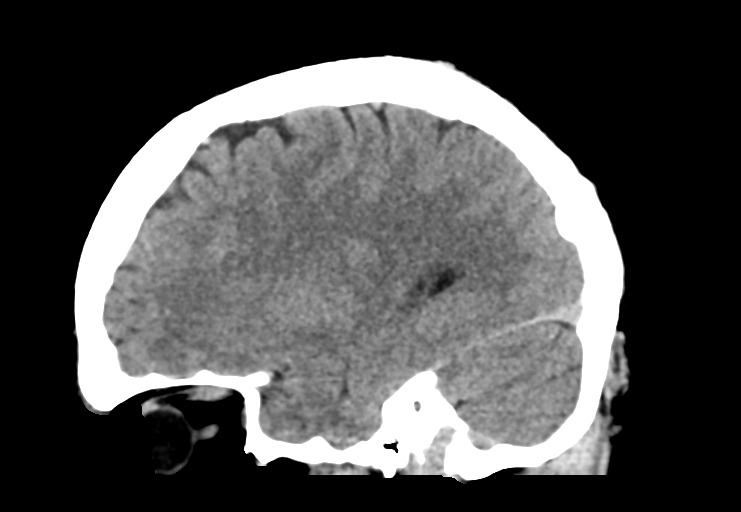
[im 26/51  brain]
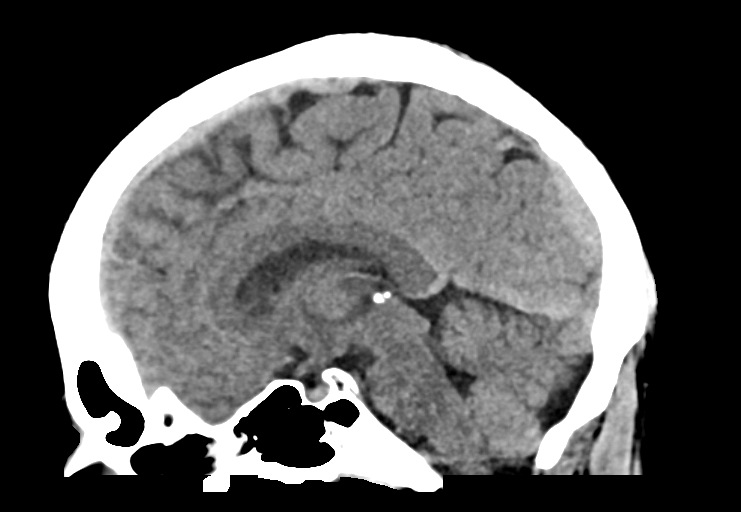
[im 34/51  brain]
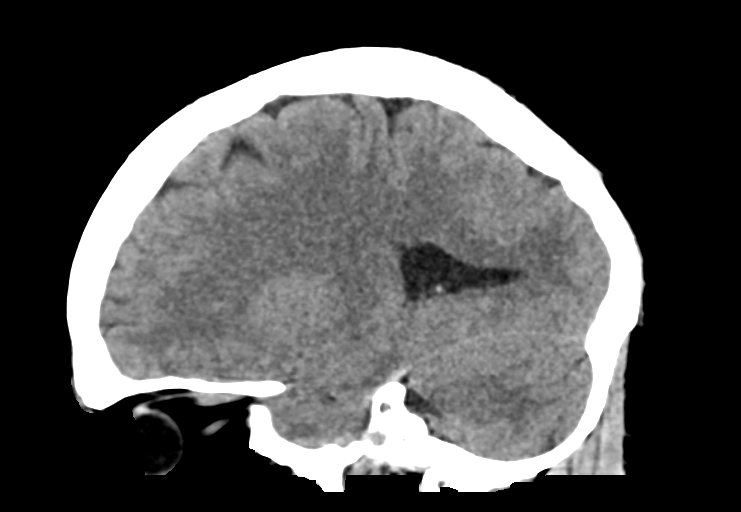

[16 of 47 positions shown; findings below may reference images not displayed]

FINDINGS: Brain: Ventricles are normal in size and configuration. All areas of
the brain demonstrate appropriate gray-white matter differentiation.
There is no mass, hemorrhage, edema or other evidence of acute
parenchymal abnormality. No extra-axial hemorrhage.

Vascular: No hyperdense vessel or unexpected calcification.

Skull: Normal. Negative for fracture or focal lesion.

Sinuses/Orbits: No acute finding.

Other: None.
IMPRESSION: Normal head CT.  No intracranial mass, hemorrhage or edema.

## 2019-07-08 DIAGNOSIS — M791 Myalgia, unspecified site: Secondary | ICD-10-CM | POA: Diagnosis not present

## 2019-07-08 DIAGNOSIS — R591 Generalized enlarged lymph nodes: Secondary | ICD-10-CM | POA: Diagnosis not present

## 2019-07-08 DIAGNOSIS — R519 Headache, unspecified: Secondary | ICD-10-CM | POA: Diagnosis not present

## 2019-07-08 DIAGNOSIS — Z6835 Body mass index (BMI) 35.0-35.9, adult: Secondary | ICD-10-CM | POA: Diagnosis not present

## 2019-07-08 DIAGNOSIS — E669 Obesity, unspecified: Secondary | ICD-10-CM | POA: Diagnosis not present

## 2019-07-08 DIAGNOSIS — R2681 Unsteadiness on feet: Secondary | ICD-10-CM | POA: Diagnosis not present

## 2019-07-08 DIAGNOSIS — E785 Hyperlipidemia, unspecified: Secondary | ICD-10-CM | POA: Diagnosis not present

## 2019-08-05 DIAGNOSIS — E669 Obesity, unspecified: Secondary | ICD-10-CM | POA: Diagnosis not present

## 2019-08-05 DIAGNOSIS — E785 Hyperlipidemia, unspecified: Secondary | ICD-10-CM | POA: Diagnosis not present

## 2019-08-05 DIAGNOSIS — R519 Headache, unspecified: Secondary | ICD-10-CM | POA: Diagnosis not present

## 2019-08-05 DIAGNOSIS — R2681 Unsteadiness on feet: Secondary | ICD-10-CM | POA: Diagnosis not present

## 2019-08-05 DIAGNOSIS — R7989 Other specified abnormal findings of blood chemistry: Secondary | ICD-10-CM | POA: Diagnosis not present

## 2019-08-05 DIAGNOSIS — M791 Myalgia, unspecified site: Secondary | ICD-10-CM | POA: Diagnosis not present

## 2019-08-05 DIAGNOSIS — R591 Generalized enlarged lymph nodes: Secondary | ICD-10-CM | POA: Diagnosis not present

## 2019-08-05 DIAGNOSIS — Z6835 Body mass index (BMI) 35.0-35.9, adult: Secondary | ICD-10-CM | POA: Diagnosis not present

## 2019-08-19 DIAGNOSIS — R2681 Unsteadiness on feet: Secondary | ICD-10-CM | POA: Diagnosis not present

## 2019-08-19 DIAGNOSIS — R591 Generalized enlarged lymph nodes: Secondary | ICD-10-CM | POA: Diagnosis not present

## 2019-08-19 DIAGNOSIS — E669 Obesity, unspecified: Secondary | ICD-10-CM | POA: Diagnosis not present

## 2019-08-19 DIAGNOSIS — M791 Myalgia, unspecified site: Secondary | ICD-10-CM | POA: Diagnosis not present

## 2019-08-19 DIAGNOSIS — R519 Headache, unspecified: Secondary | ICD-10-CM | POA: Diagnosis not present

## 2019-08-19 DIAGNOSIS — Z1331 Encounter for screening for depression: Secondary | ICD-10-CM | POA: Diagnosis not present

## 2019-08-19 DIAGNOSIS — R7989 Other specified abnormal findings of blood chemistry: Secondary | ICD-10-CM | POA: Diagnosis not present

## 2019-08-19 DIAGNOSIS — Z7689 Persons encountering health services in other specified circumstances: Secondary | ICD-10-CM | POA: Diagnosis not present

## 2019-08-19 DIAGNOSIS — Z6835 Body mass index (BMI) 35.0-35.9, adult: Secondary | ICD-10-CM | POA: Diagnosis not present

## 2019-08-19 DIAGNOSIS — E785 Hyperlipidemia, unspecified: Secondary | ICD-10-CM | POA: Diagnosis not present

## 2019-10-15 DIAGNOSIS — Z7689 Persons encountering health services in other specified circumstances: Secondary | ICD-10-CM | POA: Diagnosis not present

## 2019-10-15 DIAGNOSIS — E785 Hyperlipidemia, unspecified: Secondary | ICD-10-CM | POA: Diagnosis not present

## 2019-10-15 DIAGNOSIS — R519 Headache, unspecified: Secondary | ICD-10-CM | POA: Diagnosis not present

## 2019-10-15 DIAGNOSIS — Z6835 Body mass index (BMI) 35.0-35.9, adult: Secondary | ICD-10-CM | POA: Diagnosis not present

## 2019-10-15 DIAGNOSIS — R591 Generalized enlarged lymph nodes: Secondary | ICD-10-CM | POA: Diagnosis not present

## 2019-10-15 DIAGNOSIS — G8929 Other chronic pain: Secondary | ICD-10-CM | POA: Diagnosis not present

## 2019-10-15 DIAGNOSIS — R7989 Other specified abnormal findings of blood chemistry: Secondary | ICD-10-CM | POA: Diagnosis not present

## 2019-10-15 DIAGNOSIS — M791 Myalgia, unspecified site: Secondary | ICD-10-CM | POA: Diagnosis not present

## 2019-10-15 DIAGNOSIS — R2681 Unsteadiness on feet: Secondary | ICD-10-CM | POA: Diagnosis not present

## 2019-10-15 DIAGNOSIS — E669 Obesity, unspecified: Secondary | ICD-10-CM | POA: Diagnosis not present

## 2019-10-21 DIAGNOSIS — R591 Generalized enlarged lymph nodes: Secondary | ICD-10-CM | POA: Diagnosis not present

## 2019-10-21 DIAGNOSIS — K760 Fatty (change of) liver, not elsewhere classified: Secondary | ICD-10-CM | POA: Diagnosis not present

## 2019-10-21 DIAGNOSIS — R599 Enlarged lymph nodes, unspecified: Secondary | ICD-10-CM | POA: Diagnosis not present

## 2019-10-23 DIAGNOSIS — R599 Enlarged lymph nodes, unspecified: Secondary | ICD-10-CM | POA: Diagnosis not present

## 2019-10-23 DIAGNOSIS — A770 Spotted fever due to Rickettsia rickettsii: Secondary | ICD-10-CM | POA: Diagnosis not present

## 2019-10-23 DIAGNOSIS — L0292 Furuncle, unspecified: Secondary | ICD-10-CM | POA: Diagnosis not present

## 2019-10-23 DIAGNOSIS — R591 Generalized enlarged lymph nodes: Secondary | ICD-10-CM

## 2019-10-23 DIAGNOSIS — R161 Splenomegaly, not elsewhere classified: Secondary | ICD-10-CM | POA: Diagnosis not present

## 2019-10-26 DIAGNOSIS — R599 Enlarged lymph nodes, unspecified: Secondary | ICD-10-CM | POA: Diagnosis not present

## 2019-11-13 DIAGNOSIS — A770 Spotted fever due to Rickettsia rickettsii: Secondary | ICD-10-CM | POA: Diagnosis not present

## 2019-11-13 DIAGNOSIS — R591 Generalized enlarged lymph nodes: Secondary | ICD-10-CM | POA: Diagnosis not present

## 2019-11-13 DIAGNOSIS — R161 Splenomegaly, not elsewhere classified: Secondary | ICD-10-CM | POA: Diagnosis not present

## 2019-11-13 DIAGNOSIS — R1084 Generalized abdominal pain: Secondary | ICD-10-CM | POA: Diagnosis not present

## 2020-01-14 DIAGNOSIS — R7989 Other specified abnormal findings of blood chemistry: Secondary | ICD-10-CM | POA: Diagnosis not present

## 2020-01-14 DIAGNOSIS — G8929 Other chronic pain: Secondary | ICD-10-CM | POA: Diagnosis not present

## 2020-01-14 DIAGNOSIS — R591 Generalized enlarged lymph nodes: Secondary | ICD-10-CM | POA: Diagnosis not present

## 2020-01-14 DIAGNOSIS — E785 Hyperlipidemia, unspecified: Secondary | ICD-10-CM | POA: Diagnosis not present

## 2020-01-14 DIAGNOSIS — Z7689 Persons encountering health services in other specified circumstances: Secondary | ICD-10-CM | POA: Diagnosis not present

## 2020-01-14 DIAGNOSIS — R2681 Unsteadiness on feet: Secondary | ICD-10-CM | POA: Diagnosis not present

## 2020-01-14 DIAGNOSIS — M791 Myalgia, unspecified site: Secondary | ICD-10-CM | POA: Diagnosis not present

## 2020-01-14 DIAGNOSIS — E669 Obesity, unspecified: Secondary | ICD-10-CM | POA: Diagnosis not present

## 2020-01-14 DIAGNOSIS — Z6835 Body mass index (BMI) 35.0-35.9, adult: Secondary | ICD-10-CM | POA: Diagnosis not present

## 2020-01-14 DIAGNOSIS — R519 Headache, unspecified: Secondary | ICD-10-CM | POA: Diagnosis not present

## 2020-03-17 DIAGNOSIS — M791 Myalgia, unspecified site: Secondary | ICD-10-CM | POA: Diagnosis not present

## 2020-03-17 DIAGNOSIS — R2681 Unsteadiness on feet: Secondary | ICD-10-CM | POA: Diagnosis not present

## 2020-03-17 DIAGNOSIS — E785 Hyperlipidemia, unspecified: Secondary | ICD-10-CM | POA: Diagnosis not present

## 2020-03-17 DIAGNOSIS — Z6835 Body mass index (BMI) 35.0-35.9, adult: Secondary | ICD-10-CM | POA: Diagnosis not present

## 2020-03-17 DIAGNOSIS — E669 Obesity, unspecified: Secondary | ICD-10-CM | POA: Diagnosis not present

## 2020-03-17 DIAGNOSIS — R519 Headache, unspecified: Secondary | ICD-10-CM | POA: Diagnosis not present

## 2020-03-17 DIAGNOSIS — H6090 Unspecified otitis externa, unspecified ear: Secondary | ICD-10-CM | POA: Diagnosis not present

## 2020-03-17 DIAGNOSIS — R7989 Other specified abnormal findings of blood chemistry: Secondary | ICD-10-CM | POA: Diagnosis not present

## 2020-03-17 DIAGNOSIS — R591 Generalized enlarged lymph nodes: Secondary | ICD-10-CM | POA: Diagnosis not present

## 2020-03-17 DIAGNOSIS — G8929 Other chronic pain: Secondary | ICD-10-CM | POA: Diagnosis not present

## 2020-03-31 DIAGNOSIS — G8929 Other chronic pain: Secondary | ICD-10-CM | POA: Diagnosis not present

## 2020-03-31 DIAGNOSIS — Z6835 Body mass index (BMI) 35.0-35.9, adult: Secondary | ICD-10-CM | POA: Diagnosis not present

## 2020-03-31 DIAGNOSIS — R7989 Other specified abnormal findings of blood chemistry: Secondary | ICD-10-CM | POA: Diagnosis not present

## 2020-03-31 DIAGNOSIS — E669 Obesity, unspecified: Secondary | ICD-10-CM | POA: Diagnosis not present

## 2020-03-31 DIAGNOSIS — M791 Myalgia, unspecified site: Secondary | ICD-10-CM | POA: Diagnosis not present

## 2020-03-31 DIAGNOSIS — R519 Headache, unspecified: Secondary | ICD-10-CM | POA: Diagnosis not present

## 2020-03-31 DIAGNOSIS — E785 Hyperlipidemia, unspecified: Secondary | ICD-10-CM | POA: Diagnosis not present

## 2020-03-31 DIAGNOSIS — R2681 Unsteadiness on feet: Secondary | ICD-10-CM | POA: Diagnosis not present

## 2020-03-31 DIAGNOSIS — R591 Generalized enlarged lymph nodes: Secondary | ICD-10-CM | POA: Diagnosis not present

## 2020-03-31 DIAGNOSIS — H9201 Otalgia, right ear: Secondary | ICD-10-CM | POA: Diagnosis not present

## 2020-04-15 DIAGNOSIS — M791 Myalgia, unspecified site: Secondary | ICD-10-CM | POA: Diagnosis not present

## 2020-04-15 DIAGNOSIS — E785 Hyperlipidemia, unspecified: Secondary | ICD-10-CM | POA: Diagnosis not present

## 2020-04-15 DIAGNOSIS — R519 Headache, unspecified: Secondary | ICD-10-CM | POA: Diagnosis not present

## 2020-04-15 DIAGNOSIS — G8929 Other chronic pain: Secondary | ICD-10-CM | POA: Diagnosis not present

## 2020-04-15 DIAGNOSIS — E669 Obesity, unspecified: Secondary | ICD-10-CM | POA: Diagnosis not present

## 2020-04-15 DIAGNOSIS — Z6836 Body mass index (BMI) 36.0-36.9, adult: Secondary | ICD-10-CM | POA: Diagnosis not present

## 2020-04-15 DIAGNOSIS — R591 Generalized enlarged lymph nodes: Secondary | ICD-10-CM | POA: Diagnosis not present

## 2020-04-15 DIAGNOSIS — R2681 Unsteadiness on feet: Secondary | ICD-10-CM | POA: Diagnosis not present

## 2020-04-15 DIAGNOSIS — R7989 Other specified abnormal findings of blood chemistry: Secondary | ICD-10-CM | POA: Diagnosis not present

## 2020-04-15 DIAGNOSIS — H9201 Otalgia, right ear: Secondary | ICD-10-CM | POA: Diagnosis not present

## 2020-04-15 DIAGNOSIS — Z7689 Persons encountering health services in other specified circumstances: Secondary | ICD-10-CM | POA: Diagnosis not present

## 2020-04-29 DIAGNOSIS — H9201 Otalgia, right ear: Secondary | ICD-10-CM | POA: Diagnosis not present

## 2020-04-29 DIAGNOSIS — H9191 Unspecified hearing loss, right ear: Secondary | ICD-10-CM | POA: Diagnosis not present

## 2020-04-29 DIAGNOSIS — H9311 Tinnitus, right ear: Secondary | ICD-10-CM | POA: Diagnosis not present

## 2020-07-27 DIAGNOSIS — R7989 Other specified abnormal findings of blood chemistry: Secondary | ICD-10-CM | POA: Diagnosis not present

## 2020-07-27 DIAGNOSIS — Z7689 Persons encountering health services in other specified circumstances: Secondary | ICD-10-CM | POA: Diagnosis not present

## 2020-07-27 DIAGNOSIS — R2681 Unsteadiness on feet: Secondary | ICD-10-CM | POA: Diagnosis not present

## 2020-07-27 DIAGNOSIS — M791 Myalgia, unspecified site: Secondary | ICD-10-CM | POA: Diagnosis not present

## 2020-07-27 DIAGNOSIS — G8929 Other chronic pain: Secondary | ICD-10-CM | POA: Diagnosis not present

## 2020-07-27 DIAGNOSIS — E785 Hyperlipidemia, unspecified: Secondary | ICD-10-CM | POA: Diagnosis not present

## 2020-07-27 DIAGNOSIS — R591 Generalized enlarged lymph nodes: Secondary | ICD-10-CM | POA: Diagnosis not present

## 2020-07-27 DIAGNOSIS — E669 Obesity, unspecified: Secondary | ICD-10-CM | POA: Diagnosis not present

## 2020-07-27 DIAGNOSIS — R519 Headache, unspecified: Secondary | ICD-10-CM | POA: Diagnosis not present

## 2020-07-27 DIAGNOSIS — Z6838 Body mass index (BMI) 38.0-38.9, adult: Secondary | ICD-10-CM | POA: Diagnosis not present

## 2020-10-21 ENCOUNTER — Inpatient Hospital Stay: Payer: Medicare HMO | Admitting: Oncology

## 2020-10-25 DIAGNOSIS — R591 Generalized enlarged lymph nodes: Secondary | ICD-10-CM | POA: Diagnosis not present

## 2020-10-25 DIAGNOSIS — Z7689 Persons encountering health services in other specified circumstances: Secondary | ICD-10-CM | POA: Diagnosis not present

## 2020-10-25 DIAGNOSIS — E785 Hyperlipidemia, unspecified: Secondary | ICD-10-CM | POA: Diagnosis not present

## 2020-10-25 DIAGNOSIS — G8929 Other chronic pain: Secondary | ICD-10-CM | POA: Diagnosis not present

## 2020-10-25 DIAGNOSIS — R2681 Unsteadiness on feet: Secondary | ICD-10-CM | POA: Diagnosis not present

## 2020-10-25 DIAGNOSIS — E669 Obesity, unspecified: Secondary | ICD-10-CM | POA: Diagnosis not present

## 2020-10-25 DIAGNOSIS — R519 Headache, unspecified: Secondary | ICD-10-CM | POA: Diagnosis not present

## 2020-10-25 DIAGNOSIS — M791 Myalgia, unspecified site: Secondary | ICD-10-CM | POA: Diagnosis not present

## 2020-10-25 DIAGNOSIS — R7989 Other specified abnormal findings of blood chemistry: Secondary | ICD-10-CM | POA: Diagnosis not present

## 2020-10-25 DIAGNOSIS — Z6838 Body mass index (BMI) 38.0-38.9, adult: Secondary | ICD-10-CM | POA: Diagnosis not present

## 2020-10-25 DIAGNOSIS — Z1331 Encounter for screening for depression: Secondary | ICD-10-CM | POA: Diagnosis not present

## 2021-01-24 DIAGNOSIS — E785 Hyperlipidemia, unspecified: Secondary | ICD-10-CM | POA: Diagnosis not present

## 2021-01-24 DIAGNOSIS — E669 Obesity, unspecified: Secondary | ICD-10-CM | POA: Diagnosis not present

## 2021-01-24 DIAGNOSIS — R519 Headache, unspecified: Secondary | ICD-10-CM | POA: Diagnosis not present

## 2021-01-24 DIAGNOSIS — R591 Generalized enlarged lymph nodes: Secondary | ICD-10-CM | POA: Diagnosis not present

## 2021-01-24 DIAGNOSIS — R7989 Other specified abnormal findings of blood chemistry: Secondary | ICD-10-CM | POA: Diagnosis not present

## 2021-01-24 DIAGNOSIS — M791 Myalgia, unspecified site: Secondary | ICD-10-CM | POA: Diagnosis not present

## 2021-01-24 DIAGNOSIS — G8929 Other chronic pain: Secondary | ICD-10-CM | POA: Diagnosis not present

## 2021-01-24 DIAGNOSIS — R2681 Unsteadiness on feet: Secondary | ICD-10-CM | POA: Diagnosis not present

## 2021-01-24 DIAGNOSIS — Z7689 Persons encountering health services in other specified circumstances: Secondary | ICD-10-CM | POA: Diagnosis not present

## 2021-01-24 DIAGNOSIS — Z6837 Body mass index (BMI) 37.0-37.9, adult: Secondary | ICD-10-CM | POA: Diagnosis not present

## 2021-02-08 ENCOUNTER — Inpatient Hospital Stay: Payer: Medicare HMO | Attending: Oncology | Admitting: Hematology and Oncology

## 2021-04-12 NOTE — Progress Notes (Signed)
[facility]  [address] [place],  [place]  [address] [phone]  Clinic Day:  04/13/2021  Referring physician: Jeanie Sewer, NP  This document serves as a record of services personally performed by Hosie Poisson, MD. It was created on their behalf by Curry,Lauren E, a trained medical scribe. The creation of this record is based on the scribe's personal observations and the provider's statements to them.  CHIEF COMPLAINT:  CC: Mild persistent adenopathy and splenomegaly  Current Treatment:  Observation   HISTORY OF PRESENT ILLNESS:  Brian Huber is a 35 y.o. male who was referred by Dr. [name] for lymphadenopathy. He underwent a CT of abdomen and pelvis on 01/02/2019 for left lower quadrant pain lasting for one year, showing no acute findings identified within abdomen or pelvis. He has a prominent spleen measuring 12.6 cm in length with a volume of 380 cc, and bilateral axillary and inguinal adenopathy, etiology indeterminate.  There is a lesion in the lateral segment of the left lobe of the liver which is compatible with a hemangioma.  He was diagnosed with [facility] Spotted Fever in 2019, for which he had to be treated with two courses of antibiotics.  He underwent a colonoscopy, which returned negative.  He has multiple complaints including myalgias, decreased appetite, headaches, fatigue, and recurrent furuncles. His chemistries did show a mildly elevated bilirubin with a normal direct bilirubin and testing for hepatitis for A, B, and C were negative. UGT1A1 revealed a single copy allele with a heterozygous pattern, confirming Gilbert's syndrome.  I checked him for hemolytic anemia and this was negative.  CT chest from August 12th revealed the axillary lymph nodes are unchanged in size, and these measure up to 1.4 cm on the left.  It also showed an unchanged 4 mm right middle lobe lung nodule, most likely benign.     PET scan  revealed hypermetabolic bilateral axillary, chest wall, external iliac chain and inguinal lymph nodes, most indicative of lymphoma.  He underwent a biopsy, and the right axillary lymph node was reactive.  Flow Cytometry was negative for a monoclonal population.  The pathologist and I discussed that this most likely represents infectious mononucleosis, but could also be consistent with autoimmune disease.  I checked for [facility] Spotted Fever, Randell Patient titers, Lyme disease, RA, and ANA.  [facility] Spotted Fever titer still revealed a 1;64.  The Epstein Bar titers revealed high levels of VCA IgG antibodies.  Review of prior MRI scans reveal a chronic lesion in the deep right posterior frontal white matter which is probably due to remote trauma, and was present on scans in 2017 and 2019.  We repeated a CT scan of the head due to his persistent headaches and this was negative. CT chest, abdomen and pelvis from April 2021 revealed a stable number of axillary and inguinal lymph nodes, which are not enlarged by CT size criteria.  In fact the axillary nodes have decreased from 10 mm to 7 mm in diameter.  An enlarged pulmonic trunk, indicative of pulmonary arterial hypertension, and hepatic steatosis were also observed.  He underwent a colonoscopy in 2020 with Dr. Melina Copa which was clear.   INTERVAL HISTORY:  Brian Huber is here for routine follow up and states that his biggest complaint is migraine headache. These can be so severe at times that he will have nausea and vomiting, and will need to lay in a dark room. He has been evaluated by a  neurologist in [place] 3 years ago, but no etiology was found and multiple scans have been negative. He was also evaluated by an ENT which was also negative. He continues to have abdominal pain diffusely. He continues to have intermittent boils of the skin. He stays hot, but denies night sweats. He is easily fatigued, but is likely deconditioned. He rates his  generalized pain as a 10/10. Blood counts are unremarkable. His  appetite is fair, and he has gained over 20 pounds since his last visit about 1 and 1/2 years ago.  He denies fever, chills or other signs of infection.  He denies nausea, vomiting, bowel issues, or abdominal pain.  He denies sore throat, cough, dyspnea, or chest pain.  REVIEW OF SYSTEMS:  Review of Systems  Constitutional:  Positive for appetite change (early satiety) and fatigue (easily fatigued). Negative for chills, fever and unexpected weight change.  HENT:  Negative.    Eyes: Negative.   Respiratory: Negative.  Negative for chest tightness, cough, hemoptysis, shortness of breath and wheezing.   Cardiovascular: Negative.  Negative for chest pain, leg swelling and palpitations.  Gastrointestinal:  Positive for abdominal pain (bilateral lower quadrants), nausea (secondary to headache) and vomiting (secondary to headache). Negative for abdominal distention, blood in stool, constipation and diarrhea.  Endocrine: Negative.   Genitourinary: Negative.  Negative for difficulty urinating, dysuria, frequency and hematuria.   Musculoskeletal: Negative.  Negative for arthralgias, back pain, flank pain, gait problem and myalgias.  Skin:        Intermittent boils of the skin  Neurological:  Positive for headaches (migraine). Negative for dizziness, extremity weakness, gait problem, light-headedness, numbness, seizures and speech difficulty.  Hematological:  Positive for adenopathy (which enlarge and shrink intermittently).  Psychiatric/Behavioral: Negative.  Negative for depression and sleep disturbance. The patient is not nervous/anxious.   All other systems reviewed and are negative.   VITALS:  Blood pressure 132/90, pulse (!) 105, temperature 98.2 F (36.8 C), temperature source Oral, resp. rate 18, height 5' 10.75" (1.797 m), weight 264 lb 8 oz (120 kg), SpO2 100 %.  Wt Readings from Last 3 Encounters:  04/13/21 264 lb 8 oz (120 kg)   03/06/18 223 lb (101.2 kg)    Body mass index is 37.15 kg/m.  Performance status (ECOG): 1 - Symptomatic but completely ambulatory  PHYSICAL EXAM:  Physical Exam Constitutional:      General: He is not in acute distress.    Appearance: Normal appearance. He is normal weight.  HENT:     Head: Normocephalic and atraumatic.  Eyes:     General: No scleral icterus.    Extraocular Movements: Extraocular movements intact.     Conjunctiva/sclera: Conjunctivae normal.     Pupils: Pupils are equal, round, and reactive to light.  Cardiovascular:     Rate and Rhythm: Regular rhythm. Tachycardia present.     Pulses: Normal pulses.     Heart sounds: Normal heart sounds. No murmur heard.   No friction rub. No gallop.  Pulmonary:     Effort: Pulmonary effort is normal. No respiratory distress.     Breath sounds: Normal breath sounds.  Abdominal:     General: Bowel sounds are normal. There is no distension.     Palpations: Abdomen is soft. There is no hepatomegaly, splenomegaly or mass.     Tenderness: There is abdominal tenderness (diffuse in all quadrants) in the epigastric area. There is no guarding or rebound.     Comments: Firmness in the left upper  quadrant, which may be his spleen  Musculoskeletal:        General: Normal range of motion.     Cervical back: Normal range of motion and neck supple.     Right lower leg: No edema.     Left lower leg: No edema.  Lymphadenopathy:     Cervical: No cervical adenopathy.  Skin:    General: Skin is warm and dry.     Comments: Evidence of healed furuncles of the abdomen.  Neurological:     General: No focal deficit present.     Mental Status: He is alert and oriented to person, place, and time. Mental status is at baseline.  Psychiatric:        Mood and Affect: Mood normal.        Behavior: Behavior normal.        Thought Content: Thought content normal.        Judgment: Judgment normal.    LABS:  No flowsheet data found. No  flowsheet data found.    STUDIES:  No results found.    HISTORY:   Allergies: No Known Allergies  Current Medications: No current outpatient medications on file.   No current facility-administered medications for this visit.     ASSESSMENT & PLAN:   Assessment:   1.  Mild persistent adenopathy and splenomegaly.  With thorough evaluation over the last 2 years, we have not found an etiology for this, but the PET scan was positive.  He did have a lymph node biopsy which was reactive.  This could be related to his persistent Randell Patient titers.  We plan to repeat CT imaging to monitor the adenopathy.  2.  Probable chronic fatigue syndrome/Fibromyalgia with significant elevated of Epstein Barr antibodies.  I have explained as much as I can to the patient and his father, but don't really know of any specific treatment or definitive test.    3.  Persistent [facility] Spotted Fever titers, despite being treated for tick fever 2-3 times.    4.  Abdominal pain with severe tenderness everywhere including both flank areas.  Yet his CT scan has been essentially negative.    5.  Hyperbilirubinemia.  We now know he has Gilbert's syndrome with a single copy of the UGT1A1*28 allele.  He is heterozygous. I explained this diagnosis.  6.  Intermittent furuncles of the skin.  He has normal immunoglobulins and I find no defect of his immune system.  Plan: At this point, we do not think he has cancer or lymphoma, and I again explained that I believe that his symptoms are due to Chronic Fatigue Syndrome.  Repeat CT scans will be obtained to reevaluate the persistent adenopathy. Otherwise, we will plan to see him back in 1 year with repeat CBC, CMP, and CT chest, abdomen, and pelvis for reevaluation and to monitor his adenopathy and splenomegaly.  His father is present for the appointment today.  They are in agreement to this plan of care.  I have answered their questions, and explained that there is  not much more that I can do for him.    I provided 15 minutes of face-to-face time during this this encounter and > 50% was spent counseling as documented under my assessment and plan.    [name], MD [facility] [address] [place] [address] Dept: [phone] Dept Fax: [phone]   I, Rita Ohara, am acting as scribe for  [name], MD  I have reviewed this report as typed by the medical scribe, and it is complete and accurate.  Hermina Barters

## 2021-04-13 ENCOUNTER — Inpatient Hospital Stay: Payer: Medicare HMO | Attending: Oncology | Admitting: Oncology

## 2021-04-13 ENCOUNTER — Encounter: Payer: Self-pay | Admitting: Oncology

## 2021-04-13 ENCOUNTER — Other Ambulatory Visit: Payer: Self-pay

## 2021-04-13 ENCOUNTER — Other Ambulatory Visit: Payer: Self-pay | Admitting: Oncology

## 2021-04-13 ENCOUNTER — Other Ambulatory Visit: Payer: Self-pay | Admitting: Hematology and Oncology

## 2021-04-13 ENCOUNTER — Inpatient Hospital Stay: Payer: Medicare HMO

## 2021-04-13 ENCOUNTER — Telehealth: Payer: Self-pay | Admitting: Oncology

## 2021-04-13 VITALS — BP 132/90 | HR 105 | Temp 98.2°F | Resp 18 | Ht 70.75 in | Wt 264.5 lb

## 2021-04-13 DIAGNOSIS — R760 Raised antibody titer: Secondary | ICD-10-CM

## 2021-04-13 DIAGNOSIS — R591 Generalized enlarged lymph nodes: Secondary | ICD-10-CM

## 2021-04-13 DIAGNOSIS — K760 Fatty (change of) liver, not elsewhere classified: Secondary | ICD-10-CM

## 2021-04-13 DIAGNOSIS — D649 Anemia, unspecified: Secondary | ICD-10-CM | POA: Diagnosis not present

## 2021-04-13 DIAGNOSIS — R590 Localized enlarged lymph nodes: Secondary | ICD-10-CM

## 2021-04-13 NOTE — Telephone Encounter (Signed)
Per 10/6 LOS, patient scheduled for Oct 2023 Appt's.  Gave patient Appt Summary Will order CT Scans after receiving PreAuthorization

## 2021-05-02 ENCOUNTER — Other Ambulatory Visit: Payer: Self-pay | Admitting: Oncology

## 2021-05-02 ENCOUNTER — Inpatient Hospital Stay: Payer: Medicare HMO | Admitting: Hematology and Oncology

## 2021-05-02 ENCOUNTER — Other Ambulatory Visit: Payer: Self-pay

## 2021-05-05 ENCOUNTER — Telehealth: Payer: Self-pay | Admitting: Oncology

## 2021-05-05 NOTE — Telephone Encounter (Signed)
Dr Hinton Rao call P2P 10/25 - CT Authorized.  Patient's CT Abd/Pelvis, Chest scheduled for 11/3 checking in at [facility] 12:45 pm.  Rescheduled 10/31 Labs, Follow Up w/Melissa to 11/4  Left Message for father to return call for Appt's

## 2021-05-05 NOTE — Telephone Encounter (Signed)
Patient's CT Scans scheduled for 11/3 - Follow Up 11/4.  Gave patient's father Orders/Instructions/Appt Calendar

## 2021-05-08 ENCOUNTER — Ambulatory Visit: Payer: Medicare HMO | Admitting: Hematology and Oncology

## 2021-05-08 ENCOUNTER — Other Ambulatory Visit: Payer: Medicare HMO

## 2021-05-11 DIAGNOSIS — J9811 Atelectasis: Secondary | ICD-10-CM | POA: Diagnosis not present

## 2021-05-11 DIAGNOSIS — R760 Raised antibody titer: Secondary | ICD-10-CM | POA: Diagnosis not present

## 2021-05-11 DIAGNOSIS — R590 Localized enlarged lymph nodes: Secondary | ICD-10-CM | POA: Diagnosis not present

## 2021-05-11 DIAGNOSIS — K760 Fatty (change of) liver, not elsewhere classified: Secondary | ICD-10-CM | POA: Diagnosis not present

## 2021-05-11 DIAGNOSIS — D1803 Hemangioma of intra-abdominal structures: Secondary | ICD-10-CM | POA: Diagnosis not present

## 2021-05-11 DIAGNOSIS — Z9889 Other specified postprocedural states: Secondary | ICD-10-CM | POA: Diagnosis not present

## 2021-05-11 DIAGNOSIS — R599 Enlarged lymph nodes, unspecified: Secondary | ICD-10-CM | POA: Diagnosis not present

## 2021-05-11 DIAGNOSIS — R162 Hepatomegaly with splenomegaly, not elsewhere classified: Secondary | ICD-10-CM | POA: Diagnosis not present

## 2021-05-12 ENCOUNTER — Inpatient Hospital Stay (INDEPENDENT_AMBULATORY_CARE_PROVIDER_SITE_OTHER): Payer: Medicare HMO | Admitting: Hematology and Oncology

## 2021-05-12 ENCOUNTER — Encounter: Payer: Self-pay | Admitting: Hematology and Oncology

## 2021-05-12 ENCOUNTER — Other Ambulatory Visit: Payer: Self-pay | Admitting: Hematology and Oncology

## 2021-05-12 ENCOUNTER — Inpatient Hospital Stay: Payer: Medicare HMO | Attending: Oncology

## 2021-05-12 DIAGNOSIS — R591 Generalized enlarged lymph nodes: Secondary | ICD-10-CM

## 2021-05-12 DIAGNOSIS — R590 Localized enlarged lymph nodes: Secondary | ICD-10-CM | POA: Diagnosis not present

## 2021-05-12 LAB — HEPATIC FUNCTION PANEL
ALT: 43 — AB (ref 10–40)
AST: 31 (ref 14–40)
Alkaline Phosphatase: 92 (ref 25–125)
Bilirubin, Total: 1.1

## 2021-05-12 LAB — COMPREHENSIVE METABOLIC PANEL
Albumin: 4.1 (ref 3.5–5.0)
Calcium: 8.7 (ref 8.7–10.7)

## 2021-05-12 LAB — BASIC METABOLIC PANEL
BUN: 10 (ref 4–21)
CO2: 26 — AB (ref 13–22)
Chloride: 106 (ref 99–108)
Creatinine: 0.9 (ref 0.6–1.3)
Glucose: 119
Potassium: 4 (ref 3.4–5.3)
Sodium: 139 (ref 137–147)

## 2021-05-12 LAB — CBC AND DIFFERENTIAL
HCT: 42 (ref 41–53)
Hemoglobin: 14.1 (ref 13.5–17.5)
Neutrophils Absolute: 4.09
Platelets: 313 (ref 150–399)
WBC: 6.1

## 2021-05-12 LAB — CBC: RBC: 4.93 (ref 3.87–5.11)

## 2021-05-12 NOTE — Assessment & Plan Note (Signed)
CT imaging from yesterday reveals stable lymphadenopathy. I reviewed results with the patient and his father. We will plan to repeat imaging in 1 year and continue observation. He has no new symptoms. He continues to follow with PCP.

## 2021-05-12 NOTE — Progress Notes (Signed)
Patient Care Team: Brian Nakai, MD as PCP - General (Internal Medicine)  Clinic Day:  05/12/2021  Referring physician: Jeanie Sewer, NP  ASSESSMENT & PLAN:   Assessment & Plan: Generalized lymphadenopathy CT imaging from yesterday reveals stable lymphadenopathy. I reviewed results with the patient and his father. We will plan to repeat imaging in 1 year and continue observation. He has no new symptoms. He continues to follow with PCP.    The patient understands the plans discussed today and is in agreement with them.  He knows to contact our office if he develops concerns prior to his next appointment.     [name], NP  [facility] [address] [place] [address] Dept: [phone] Dept Fax: [phone]   No orders of the defined types were placed in this encounter.     CHIEF COMPLAINT:  CC: A 35 year old male with history of lymphadenopathy here for one year evaluation  Current Treatment:  Surveillance  INTERVAL HISTORY:  Brian Huber is here today for repeat clinical assessment. He denies fevers or chills. He denies pain. His appetite is good. His weight has been stable.  I have reviewed the past medical history, past surgical history, social history and family history with the patient and they are unchanged from previous note.  ALLERGIES:  has No Known Allergies.  MEDICATIONS:  No current outpatient medications on file.   No current facility-administered medications for this visit.    HISTORY OF PRESENT ILLNESS:   Oncology History   No history exists.      REVIEW OF SYSTEMS:   Constitutional: Denies fevers, chills or abnormal weight loss Eyes: Denies blurriness of vision Ears, nose, mouth, throat, and face: Denies mucositis or sore throat Respiratory: Denies cough, dyspnea or wheezes Cardiovascular: Denies palpitation, chest discomfort or lower extremity  swelling Gastrointestinal:  Denies nausea, heartburn or change in bowel habits Skin: Denies abnormal skin rashes Lymphatics: Denies new lymphadenopathy or easy bruising Neurological:Denies numbness, tingling or new weaknesses Behavioral/Psych: Mood is stable, no new changes  All other systems were reviewed with the patient and are negative.   VITALS:  Blood pressure (!) 148/86, pulse 95, temperature 98.4 F (36.9 C), temperature source Oral, resp. rate (!) 22, height 5' 10.75" (1.797 m), weight 267 lb 1.6 oz (121.2 kg), SpO2 98 %.  Wt Readings from Last 3 Encounters:  05/12/21 267 lb 1.6 oz (121.2 kg)  04/13/21 264 lb 8 oz (120 kg)  03/06/18 223 lb (101.2 kg)    Body mass index is 37.52 kg/m.  Performance status (ECOG): 1 - Symptomatic but completely ambulatory  PHYSICAL EXAM:   GENERAL:alert, no distress and comfortable SKIN: skin color, texture, turgor are normal, no rashes or significant lesions EYES: normal, Conjunctiva are pink and non-injected, sclera clear OROPHARYNX:no exudate, no erythema and lips, buccal mucosa, and tongue normal  NECK: supple, thyroid normal size, non-tender, without nodularity LYMPH:  no palpable lymphadenopathy in the cervical, axillary or inguinal LUNGS: clear to auscultation and percussion with normal breathing effort HEART: regular rate & rhythm and no murmurs and no lower extremity edema ABDOMEN:abdomen soft, non-tender and normal bowel sounds Musculoskeletal:no cyanosis of digits and no clubbing  NEURO: alert & oriented x 3 with fluent speech, no focal motor/sensory deficits  LABORATORY DATA:  I have reviewed the data as listed    Component Value Date/Time   NA 139 05/12/2021 0000   K 4.0 05/12/2021 0000   CL 106 05/12/2021  0000   CO2 26 (A) 05/12/2021 0000   BUN 10 05/12/2021 0000   CREATININE 0.9 05/12/2021 0000   CALCIUM 8.7 05/12/2021 0000   ALBUMIN 4.1 05/12/2021 0000   AST 31 05/12/2021 0000   ALT 43 (A) 05/12/2021 0000    ALKPHOS 92 05/12/2021 0000    No results found for: SPEP, UPEP  Lab Results  Component Value Date   WBC 6.1 05/12/2021   NEUTROABS 4.09 05/12/2021   HGB 14.1 05/12/2021   HCT 42 05/12/2021   PLT 313 05/12/2021      Chemistry      Component Value Date/Time   NA 139 05/12/2021 0000   K 4.0 05/12/2021 0000   CL 106 05/12/2021 0000   CO2 26 (A) 05/12/2021 0000   BUN 10 05/12/2021 0000   CREATININE 0.9 05/12/2021 0000   GLU 119 05/12/2021 0000      Component Value Date/Time   CALCIUM 8.7 05/12/2021 0000   ALKPHOS 92 05/12/2021 0000   AST 31 05/12/2021 0000   ALT 43 (A) 05/12/2021 0000       RADIOGRAPHIC STUDIES: I have personally reviewed the radiological images as listed and agreed with the findings in the report.   Exam(s): [id number] CT/CT CHEST-ABD-PELV W/IV CM  CLINICAL DATA: A 35 year old male presents with enlarged axillary  lymph nodes reportedly post lymph node resection also with reported  history of splenomegaly.  EXAM:  CT CHEST, ABDOMEN, AND PELVIS WITH CONTRAST  TECHNIQUE:  Multidetector CT imaging of the chest, abdomen and pelvis was  performed following the standard protocol during bolus  administration of intravenous contrast.  CONTRAST: 100 cc Isovue 370  COMPARISON: October 21, 2019.  FINDINGS:  CT CHEST FINDINGS  Cardiovascular: Normal appearance of heart great vessels on venous  phase.  Mediastinum/Nodes: Scattered bilateral axillary lymph nodes none  displaying pathologic enlargement. Rounded lymph nodes in the low  LEFT and RIGHT axilla up to 9 mm. Previously approximately 6 mm. No  thoracic inlet adenopathy. No mediastinal or hilar adenopathy.  Esophagus grossly normal.  Lungs/Pleura: Airways are patent. Minimal basilar atelectasis. No  effusion.  Musculoskeletal: See below for full musculoskeletal details.  CT ABDOMEN PELVIS FINDINGS  Hepatobiliary: Hepatic    steatosis and hepatomegaly. Liver measuring  23 cm greatest craniocaudal  span. Signs of a moderate-sized hepatic  hemangioma in hepatic subsegment II measuring 3.9 x 3.4 cm similar  compared to recent imaging. Portal vein is patent. No frank surface  nodularity. No additional focal hepatic lesion. No pericholecystic  stranding or biliary duct distension.  Pancreas: Normal, without mass, inflammation or ductal dilatation.  Spleen: 15.4 cm craniocaudal dimension of the spleen previously  approximately 15 cm. No focal splenic lesion.  Adrenals/Urinary Tract:  Adrenal glands are unremarkable. Symmetric renal enhancement. No  sign of hydronephrosis. No suspicious renal lesion or perinephric  stranding.  Urinary bladder is grossly unremarkable.  No nephrolithiasis.  Stomach/Bowel: No acute gastrointestinal process. Normal appendix.  Vascular/Lymphatic:  Aorta with smooth contours. IVC with smooth contours. No aneurysmal  dilation of the abdominal aorta. There is no gastrohepatic or  hepatoduodenal ligament lymphadenopathy. No retroperitoneal or  mesenteric lymphadenopathy.  No pelvic sidewall lymphadenopathy. Bilateral groin lymph nodes  largest 10 mm in the RIGHT groin (image 120/2) previously 9 mm.  Reproductive: Unremarkable on CT.  Other: No ascites.  Musculoskeletal: No acute or significant osseous findings.  IMPRESSION:  Unenlarged but mildly prominent lymph nodes in the bilateral  axillary regions, not enlarged by CT criteria, more numerous  than  normal and minimally enlarged over 1 year interval. Correlate with  reported biopsy. Based on history lymphoproliferative disorder  remains a differential consideration.  Top-normal size groin lymph nodes bilaterally are nonspecific, in  the absence of any history of malignancy these are likely normal.  Again correlation with reported biopsy results may be helpful. These  are within 1 mm of previous measurements in terms of largest lymph  node in the groin.  Above findings seen in the context of persistent  splenic  enlargement. Would also correlate with any signs of liver disease as  outlined below.  Hepatosplenomegaly with moderate to marked hepatic steatosis.  Correlate with any clinical or laboratory evidence of liver disease.  Splenic size is within 3-4 mm greatest craniocaudal span compared to  previous imaging.  Moderate size hepatic hemangioma in the LEFT hepatic lobe  No adenopathy in the abdomen or pelvis.  Electronically Signed  By: Zetta Bills M.D.  On: 05/12/2021 09:45  Electronically Signed By: Felipa Emory MD  Electronically Signed Date/Time: 11/04/220948  Dictate Date/Time: 05/12/21 0920  Technologist: Ignacia Marvel  Transcribed By: Odie Sera  Transcribed Date/Time: 05/12/21 0945

## 2021-05-16 ENCOUNTER — Encounter: Payer: Self-pay | Admitting: Oncology

## 2021-07-25 DIAGNOSIS — E785 Hyperlipidemia, unspecified: Secondary | ICD-10-CM | POA: Diagnosis not present

## 2021-07-25 DIAGNOSIS — G8929 Other chronic pain: Secondary | ICD-10-CM | POA: Diagnosis not present

## 2021-07-25 DIAGNOSIS — M791 Myalgia, unspecified site: Secondary | ICD-10-CM | POA: Diagnosis not present

## 2021-07-25 DIAGNOSIS — R7989 Other specified abnormal findings of blood chemistry: Secondary | ICD-10-CM | POA: Diagnosis not present

## 2021-07-25 DIAGNOSIS — R591 Generalized enlarged lymph nodes: Secondary | ICD-10-CM | POA: Diagnosis not present

## 2021-07-25 DIAGNOSIS — E669 Obesity, unspecified: Secondary | ICD-10-CM | POA: Diagnosis not present

## 2021-07-25 DIAGNOSIS — R2681 Unsteadiness on feet: Secondary | ICD-10-CM | POA: Diagnosis not present

## 2021-07-25 DIAGNOSIS — R519 Headache, unspecified: Secondary | ICD-10-CM | POA: Diagnosis not present

## 2021-07-25 DIAGNOSIS — Z6839 Body mass index (BMI) 39.0-39.9, adult: Secondary | ICD-10-CM | POA: Diagnosis not present

## 2021-10-23 DIAGNOSIS — R519 Headache, unspecified: Secondary | ICD-10-CM | POA: Diagnosis not present

## 2021-10-23 DIAGNOSIS — Z6841 Body Mass Index (BMI) 40.0 and over, adult: Secondary | ICD-10-CM | POA: Diagnosis not present

## 2021-10-23 DIAGNOSIS — R591 Generalized enlarged lymph nodes: Secondary | ICD-10-CM | POA: Diagnosis not present

## 2021-10-23 DIAGNOSIS — R7989 Other specified abnormal findings of blood chemistry: Secondary | ICD-10-CM | POA: Diagnosis not present

## 2021-10-23 DIAGNOSIS — E669 Obesity, unspecified: Secondary | ICD-10-CM | POA: Diagnosis not present

## 2021-10-23 DIAGNOSIS — G8929 Other chronic pain: Secondary | ICD-10-CM | POA: Diagnosis not present

## 2021-10-23 DIAGNOSIS — M791 Myalgia, unspecified site: Secondary | ICD-10-CM | POA: Diagnosis not present

## 2021-10-23 DIAGNOSIS — E785 Hyperlipidemia, unspecified: Secondary | ICD-10-CM | POA: Diagnosis not present

## 2021-10-23 DIAGNOSIS — R2681 Unsteadiness on feet: Secondary | ICD-10-CM | POA: Diagnosis not present

## 2022-01-01 ENCOUNTER — Other Ambulatory Visit: Payer: Self-pay | Admitting: Hematology and Oncology

## 2022-01-01 ENCOUNTER — Telehealth: Payer: Self-pay

## 2022-01-01 DIAGNOSIS — R590 Localized enlarged lymph nodes: Secondary | ICD-10-CM

## 2022-01-04 NOTE — Progress Notes (Signed)
Patient Care Team: Brian Nakai, MD as PCP - General (Internal Medicine) Brian Kaplan, MD as Consulting Physician (Oncology) [name], NP as Nurse Practitioner (Hematology and Oncology)  Clinic Day: 01/05/22  Referring physician: [name], MD  ASSESSMENT & PLAN:   Assessment & Plan: Generalized lymphadenopathy There was a slight increase in the axillary adenopathy but biopsy revealed reactive node only.  Hepatic steatosis  Chronic fatigue syndrome He has elevated Epstein Barr virus titers and lymph node biopsy was reactive.  Gilbert's syndrome He is heterozygous for UGT 1A1 mutation.  Skin lesions appear suspicious for dermatomycosis I will order topical Nizoral and oral Diflucan due to the generalized nature of these.  If they do not resolve, he will need referral to the dermatologist.   I will order Nizoral cream to apply bid to the skin lesions and oral Diflucan 200 mg daily for 10 days with  1 refill.  If this does not resolve, he will need referral to the dermatologist.  Otherwise he will keep his scheduled appointments in November for labs and scans with follow up appointment with me afterward to review results. The patient and his father understand the plans discussed today and are in agreement with them.  He knows to contact our office if he develops concerns prior to his next appointment.     Brian Kaplan, MD  [facility] AT [facility] [address] [place] [address] Dept: [phone] Dept Fax: [phone]   Orders Placed This Encounter  Procedures   CBC and differential    This external order was created through the Results Console.   CBC    This external order was created through the Results Console.   Basic metabolic panel    This external order was created through the Results Console.   Comprehensive metabolic panel    This external order was created through the Results  Console.   Hepatic function panel    This external order was created through the Results Console.      CHIEF COMPLAINT:  CC: A 36 year old male with history of lymphadenopathy here for one year evaluation  Current Treatment:  Surveillance  INTERVAL HISTORY:  Brian Huber is here today for repeat clinical assessment. He is scheduled for repeat labs and scans in November to follow up on his lymphadenopathy. But he was added today for multiple red raised areas of his arms and legs.  These seem to improve with cortisone but then worsen again.  They itch and burn and have persisted for 2 weeks.  He still has nausea and diarrhea with left sided abdominal pain.  He also complains of pain of the scalp and not sleeping well.  His appetite is not as good and he complains of left knee pain. He denies fevers or chills. He denies pain. His appetite is good.His weight has increased 12 pounds since November of 2022.  I have reviewed the past medical history, past surgical history, social history and family history with the patient and they are unchanged from previous note.  ALLERGIES:  has No Known Allergies.  MEDICATIONS:  Current Outpatient Medications  Medication Sig Dispense Refill   fluconazole (DIFLUCAN) 200 MG tablet Take 1 tablet (200 mg total) by mouth daily. 10 tablet 1   ketoconazole (NIZORAL) 2 % cream Apply 1 Application topically 2 (two) times daily. 30 g 1   No current facility-administered medications for this visit.    HISTORY OF PRESENT ILLNESS:  Brian Huber is a 36 y.o. male who was referred by Dr. Cher Huber for lymphadenopathy. He underwent a CT of abdomen and pelvis on 01/02/2019 for left lower quadrant pain lasting for one year, showing no acute findings identified within abdomen or pelvis. He has a prominent spleen measuring 12.6 cm in length with a volume of 380 cc, and bilateral axillary and inguinal adenopathy, etiology indeterminate.  There is a lesion in the lateral segment of  the left lobe of the liver which is compatible with a hemangioma.  He was diagnosed with [facility] Spotted Fever in 2019, for which he had to be treated with two courses of antibiotics.  He underwent a colonoscopy, which returned negative.  He has multiple complaints including myalgias, decreased appetite, headaches, fatigue, and recurrent furuncles. His chemistries did show a mildly elevated bilirubin with a normal direct bilirubin and testing for hepatitis for A, B, and C were negative. UGT1A1 revealed a single copy allele with a heterozygous pattern, confirming Gilbert's syndrome.  I checked him for hemolytic anemia and this was negative.  CT chest from August 12th revealed the axillary lymph nodes are unchanged in size, and these measure up to 1.4 cm on the left.  It also showed an unchanged 4 mm right middle lobe lung nodule, most likely benign.      PET scan revealed hypermetabolic bilateral axillary, chest wall, external iliac chain and inguinal lymph nodes, most indicative of lymphoma.  He underwent a biopsy, and the right axillary lymph node was reactive.  Flow Cytometry was negative for a monoclonal population.  The pathologist and I discussed that this most likely represents infectious mononucleosis, but could also be consistent with autoimmune disease.  I checked for [facility] Spotted Fever, Randell Patient titers, Lyme disease, RA, and ANA.  The [facility] Spotted Fever titer still revealed a 1;64.  The Epstein Bar titers revealed high levels of VCA IgG antibodies.  Review of prior MRI scans reveal a chronic lesion in the deep right posterior frontal white matter which is probably due to remote trauma, and was present on scans in 2017 and 2019.  We repeated a CT scan of the head due to his persistent headaches and this was negative. CT chest, abdomen and pelvis from April 2021 revealed a stable number of axillary and inguinal lymph nodes, which are not enlarged by CT size criteria.  In fact  the axillary nodes have decreased from 10 mm to 7 mm in diameter.  An enlarged pulmonic trunk, indicative of pulmonary arterial hypertension, and hepatic steatosis were also observed.  He underwent a colonoscopy in 2020 with Dr. Melina Copa which was clear.    Oncology History   No history exists.      REVIEW OF SYSTEMS:   Constitutional: Denies fevers, chills or abnormal weight loss Eyes: Denies blurriness of vision Ears, nose, mouth, throat, and face: Denies mucositis or sore throat Respiratory: Denies cough, dyspnea or wheezes Cardiovascular: Denies palpitation, chest discomfort or lower extremity swelling Gastrointestinal:  Denies nausea, heartburn or change in bowel habits Skin:He has scattered red raised skin lesions of his arms and legs Lymphatics: Denies new lymphadenopathy or easy bruising Neurological:Denies numbness, tingling or new weaknesses Behavioral/Psych: Mood is stable, no new changes  All other systems were reviewed with the patient and are negative.   VITALS:  Blood pressure 135/86, pulse (!) 116, temperature (!) 97 F (36.1 C), temperature source Oral, resp. rate 20, height 5' 10.75" (1.797 m), weight 279 lb 1.6  oz (126.6 kg), SpO2 98 %.  Wt Readings from Last 3 Encounters:  01/05/22 279 lb 1.6 oz (126.6 kg)  05/12/21 267 lb 1.6 oz (121.2 kg)  04/13/21 264 lb 8 oz (120 kg)    Body mass index is 39.2 kg/m.  Performance status (ECOG): 1 - Symptomatic but completely ambulatory  PHYSICAL EXAM:   GENERAL:alert, no distress and comfortable SKIN: skin color, texture, turgor are normal,  He has multiple red raised lesions of his extremities which appear fungal to me EYES: normal, Conjunctiva are pink and non-injected, sclera clear OROPHARYNX:no exudate, no erythema and lips, buccal mucosa, and tongue normal  NECK: supple, thyroid normal size, non-tender, without nodularity LYMPH:  no palpable lymphadenopathy in the cervical, axillary or inguinal LUNGS: clear to  auscultation and percussion with normal breathing effort HEART: regular rate & rhythm and no murmurs and no lower extremity edema ABDOMEN:abdomen soft, non-tender and normal bowel sounds Musculoskeletal:no cyanosis of digits and no clubbing  NEURO: alert & oriented x 3 with fluent speech, no focal motor/sensory deficits  LABORATORY DATA:  I have reviewed the data as listed    Component Value Date/Time   NA 138 01/05/2022 0000   K 4.0 01/05/2022 0000   CL 101 01/05/2022 0000   CO2 27 (A) 01/05/2022 0000   BUN 7 01/05/2022 0000   CREATININE 0.7 01/05/2022 0000   CALCIUM 9.2 01/05/2022 0000   ALBUMIN 4.5 01/05/2022 0000   AST 46 (A) 01/05/2022 0000   ALT 51 (A) 01/05/2022 0000   ALKPHOS 75 01/05/2022 0000    No results found for: "SPEP", "UPEP"  Lab Results  Component Value Date   WBC 6.5 01/05/2022   NEUTROABS 4.88 01/05/2022   HGB 14.2 01/05/2022   HCT 41 01/05/2022   PLT 346 01/05/2022      Chemistry      Component Value Date/Time   NA 138 01/05/2022 0000   K 4.0 01/05/2022 0000   CL 101 01/05/2022 0000   CO2 27 (A) 01/05/2022 0000   BUN 7 01/05/2022 0000   CREATININE 0.7 01/05/2022 0000   GLU 128 01/05/2022 0000      Component Value Date/Time   CALCIUM 9.2 01/05/2022 0000   ALKPHOS 75 01/05/2022 0000   AST 46 (A) 01/05/2022 0000   ALT 51 (A) 01/05/2022 0000       RADIOGRAPHIC STUDIES: I have personally reviewed the radiological images as listed and agreed with the findings in the report.   Exam(s): [id number] CT/CT CHEST-ABD-PELV W/IV CM  CLINICAL DATA: A 36 year old male presents with enlarged axillary  lymph nodes reportedly post lymph node resection also with reported  history of splenomegaly.  EXAM:  CT CHEST, ABDOMEN, AND PELVIS WITH CONTRAST  TECHNIQUE:  Multidetector CT imaging of the chest, abdomen and pelvis was  performed following the standard protocol during bolus  administration of intravenous contrast.  CONTRAST: 100 cc Isovue 370   COMPARISON: October 21, 2019.  FINDINGS:  CT CHEST FINDINGS  Cardiovascular: Normal appearance of heart great vessels on venous  phase.  Mediastinum/Nodes: Scattered bilateral axillary lymph nodes none  displaying pathologic enlargement. Rounded lymph nodes in the low  LEFT and RIGHT axilla up to 9 mm. Previously approximately 6 mm. No  thoracic inlet adenopathy. No mediastinal or hilar adenopathy.  Esophagus grossly normal.  Lungs/Pleura: Airways are patent. Minimal basilar atelectasis. No  effusion.  Musculoskeletal: See below for full musculoskeletal details.  CT ABDOMEN PELVIS FINDINGS  Hepatobiliary: Hepatic    steatosis and hepatomegaly.  Liver measuring  23 cm greatest craniocaudal span. Signs of a moderate-sized hepatic  hemangioma in hepatic subsegment II measuring 3.9 x 3.4 cm similar  compared to recent imaging. Portal vein is patent. No frank surface  nodularity. No additional focal hepatic lesion. No pericholecystic  stranding or biliary duct distension.  Pancreas: Normal, without mass, inflammation or ductal dilatation.  Spleen: 15.4 cm craniocaudal dimension of the spleen previously  approximately 15 cm. No focal splenic lesion.  Adrenals/Urinary Tract:  Adrenal glands are unremarkable. Symmetric renal enhancement. No  sign of hydronephrosis. No suspicious renal lesion or perinephric  stranding.  Urinary bladder is grossly unremarkable.  No nephrolithiasis.  Stomach/Bowel: No acute gastrointestinal process. Normal appendix.  Vascular/Lymphatic:  Aorta with smooth contours. IVC with smooth contours. No aneurysmal  dilation of the abdominal aorta. There is no gastrohepatic or  hepatoduodenal ligament lymphadenopathy. No retroperitoneal or  mesenteric lymphadenopathy.  No pelvic sidewall lymphadenopathy. Bilateral groin lymph nodes  largest 10 mm in the RIGHT groin (image 120/2) previously 9 mm.  Reproductive: Unremarkable on CT.  Other: No ascites.   Musculoskeletal: No acute or significant osseous findings.  IMPRESSION:  Unenlarged but mildly prominent lymph nodes in the bilateral  axillary regions, not enlarged by CT criteria, more numerous than  normal and minimally enlarged over 1 year interval. Correlate with  reported biopsy. Based on history lymphoproliferative disorder  remains a differential consideration.  Top-normal size groin lymph nodes bilaterally are nonspecific, in  the absence of any history of malignancy these are likely normal.  Again correlation with reported biopsy results may be helpful. These  are within 1 mm of previous measurements in terms of largest lymph  node in the groin.  Above findings seen in the context of persistent splenic  enlargement. Would also correlate with any signs of liver disease as  outlined below.  Hepatosplenomegaly with moderate to marked hepatic steatosis.  Correlate with any clinical or laboratory evidence of liver disease.  Splenic size is within 3-4 mm greatest craniocaudal span compared to  previous imaging.  Moderate size hepatic hemangioma in the LEFT hepatic lobe  No adenopathy in the abdomen or pelvis.  Electronically Signed  By: Zetta Bills M.D.  On: 05/12/2021 09:45  Electronically Signed By: Felipa Emory MD  Electronically Signed Date/Time: 11/04/220948  Dictate Date/Time: 05/12/21 0920  Technologist: Ignacia Marvel  Transcribed By: Odie Sera  Transcribed Date/Time: 05/12/21 0945

## 2022-01-05 ENCOUNTER — Encounter: Payer: Self-pay | Admitting: Oncology

## 2022-01-05 ENCOUNTER — Other Ambulatory Visit: Payer: Self-pay | Admitting: Oncology

## 2022-01-05 ENCOUNTER — Inpatient Hospital Stay: Payer: Medicare HMO

## 2022-01-05 ENCOUNTER — Inpatient Hospital Stay: Payer: Medicare HMO | Attending: Oncology | Admitting: Oncology

## 2022-01-05 ENCOUNTER — Other Ambulatory Visit: Payer: Medicare HMO

## 2022-01-05 VITALS — BP 135/86 | HR 116 | Temp 97.0°F | Resp 20 | Ht 70.75 in | Wt 279.1 lb

## 2022-01-05 DIAGNOSIS — K760 Fatty (change of) liver, not elsewhere classified: Secondary | ICD-10-CM

## 2022-01-05 DIAGNOSIS — D649 Anemia, unspecified: Secondary | ICD-10-CM | POA: Diagnosis not present

## 2022-01-05 DIAGNOSIS — R591 Generalized enlarged lymph nodes: Secondary | ICD-10-CM

## 2022-01-05 DIAGNOSIS — R590 Localized enlarged lymph nodes: Secondary | ICD-10-CM | POA: Diagnosis not present

## 2022-01-05 DIAGNOSIS — R760 Raised antibody titer: Secondary | ICD-10-CM | POA: Diagnosis not present

## 2022-01-05 DIAGNOSIS — B369 Superficial mycosis, unspecified: Secondary | ICD-10-CM

## 2022-01-05 LAB — BASIC METABOLIC PANEL
BUN: 7 (ref 4–21)
CO2: 27 — AB (ref 13–22)
Chloride: 101 (ref 99–108)
Creatinine: 0.7 (ref 0.6–1.3)
Glucose: 128
Potassium: 4 mEq/L (ref 3.5–5.1)
Sodium: 138 (ref 137–147)

## 2022-01-05 LAB — COMPREHENSIVE METABOLIC PANEL
Albumin: 4.5 (ref 3.5–5.0)
Calcium: 9.2 (ref 8.7–10.7)

## 2022-01-05 LAB — CBC AND DIFFERENTIAL
HCT: 41 (ref 41–53)
Hemoglobin: 14.2 (ref 13.5–17.5)
Neutrophils Absolute: 4.88
Platelets: 346 10*3/uL (ref 150–400)
WBC: 6.5

## 2022-01-05 LAB — HEPATIC FUNCTION PANEL
ALT: 51 U/L — AB (ref 10–40)
AST: 46 — AB (ref 14–40)
Alkaline Phosphatase: 75 (ref 25–125)
Bilirubin, Total: 1.6

## 2022-01-05 LAB — CBC: RBC: 4.96 (ref 3.87–5.11)

## 2022-01-05 MED ORDER — FLUCONAZOLE 200 MG PO TABS: 200.0000 mg | ORAL_TABLET | Freq: Every day | ORAL | 1 refills | Status: DC

## 2022-01-05 MED ORDER — KETOCONAZOLE 2 % EX CREA: 1.0000 | TOPICAL_CREAM | Freq: Two times a day (BID) | CUTANEOUS | 1 refills | Status: DC

## 2022-02-02 ENCOUNTER — Other Ambulatory Visit: Payer: Self-pay | Admitting: Oncology

## 2022-02-02 DIAGNOSIS — R591 Generalized enlarged lymph nodes: Secondary | ICD-10-CM

## 2022-04-09 DIAGNOSIS — Z6839 Body mass index (BMI) 39.0-39.9, adult: Secondary | ICD-10-CM | POA: Diagnosis not present

## 2022-04-09 DIAGNOSIS — G8929 Other chronic pain: Secondary | ICD-10-CM | POA: Diagnosis not present

## 2022-04-09 DIAGNOSIS — R030 Elevated blood-pressure reading, without diagnosis of hypertension: Secondary | ICD-10-CM | POA: Diagnosis not present

## 2022-04-09 DIAGNOSIS — E785 Hyperlipidemia, unspecified: Secondary | ICD-10-CM | POA: Diagnosis not present

## 2022-04-09 DIAGNOSIS — R519 Headache, unspecified: Secondary | ICD-10-CM | POA: Diagnosis not present

## 2022-04-09 DIAGNOSIS — R2681 Unsteadiness on feet: Secondary | ICD-10-CM | POA: Diagnosis not present

## 2022-04-09 DIAGNOSIS — R7301 Impaired fasting glucose: Secondary | ICD-10-CM | POA: Diagnosis not present

## 2022-04-09 DIAGNOSIS — R7989 Other specified abnormal findings of blood chemistry: Secondary | ICD-10-CM | POA: Diagnosis not present

## 2022-04-24 ENCOUNTER — Telehealth: Payer: Self-pay | Admitting: Oncology

## 2022-04-24 NOTE — Telephone Encounter (Signed)
CT C/A/P has been scheduled for 05/16/22 @ 9 am; Check in @ 8 am  Contacted pt to notify him of the following appt but unable to reach via phone.

## 2022-04-27 ENCOUNTER — Other Ambulatory Visit: Payer: Medicare HMO

## 2022-04-27 ENCOUNTER — Ambulatory Visit: Payer: Medicare HMO | Admitting: Oncology

## 2022-05-02 ENCOUNTER — Other Ambulatory Visit: Payer: Medicare HMO

## 2022-05-11 DIAGNOSIS — R590 Localized enlarged lymph nodes: Secondary | ICD-10-CM | POA: Diagnosis not present

## 2022-05-11 DIAGNOSIS — R591 Generalized enlarged lymph nodes: Secondary | ICD-10-CM | POA: Diagnosis not present

## 2022-05-11 DIAGNOSIS — K760 Fatty (change of) liver, not elsewhere classified: Secondary | ICD-10-CM | POA: Diagnosis not present

## 2022-05-11 LAB — CBC AND DIFFERENTIAL
HCT: 38 — AB (ref 41–53)
Hemoglobin: 13 — AB (ref 13.5–17.5)
Neutrophils Absolute: 3.36
Platelets: 359 10*3/uL (ref 150–400)
WBC: 5.5

## 2022-05-11 LAB — CBC: RBC: 4.6 (ref 3.87–5.11)

## 2022-05-13 NOTE — Progress Notes (Signed)
Patient Care Team: [name], MD as PCP - General (Internal Medicine) [name], MD as Consulting Physician (Oncology) [name], NP as Nurse Practitioner (Hematology and Oncology)  Clinic Day: 05/14/22  Referring physician: [name], MD  ASSESSMENT & PLAN:   Assessment & Plan: Generalized lymphadenopathy There is now a slight decrease in the axillary adenopathy and they are normal size.  Previous biopsy revealed reactive node only.  Hepatic steatosis  Chronic fatigue syndrome He has elevated Epstein Barr virus titers and lymph node biopsy was reactive.  Gilbert's syndrome He is heterozygous for UGT 1A1 mutation.  Mild anemia I will check iron levels, B12 and folate and call him on those results.  Decreased urination He says he drinks a lot of fluid but only urinates once or twice a day and not a large amount.  For completeness I will check a urinalysis.   I will check a urinalysis to evaluate his oliguria.  I will do an evaluation of his anemia and call him on these results.  He has many issues but I do not have much suggestion to help.  I do think he has some deconditioning.  At this point I do not have much else to offer and we will simply see him as needed.The patient and his father understand the plans discussed today and are in agreement with them.    [name], MD  [facility] [address] [place] [address] Dept: [phone] Dept Fax: [phone]   ADDENDUM: Urinalysis reveals proteinuria with a protein of 30 and a high specific gravity of 1.024, indicating concentrated urine.  I think he is not drinking as much as he thinks.  His iron levels are consistent with iron deficiency.  His iron is 46 with a TIBC of 417 4 and iron saturation of 11%.  I have therefore recommended that he get on iron supplement once daily.  His B12 level is low normal at 209 and so I will  also recommend an oral B12 supplement.  His folate level was normal.   CHIEF COMPLAINT:  CC: A 36 year old male with history of lymphadenopathy here for one year evaluation  Current Treatment:  Surveillance  INTERVAL HISTORY:  Jhonnie is here today for repeat clinical assessment. He had repeat labs and scans in November to follow up on his lymphadenopathy.  This shows a slight interval decrease in the size of the prominent bilateral axillary lymph nodes now down to 7 mm on the left and 6 mm on the right, previously 9 mm.  He has no new or progressive adenopathy in chest, abdomen or pelvis.  He still has hepatosplenomegaly with diffuse hepatic steatosis and a stable hepatic hemangioma.  There is mild symmetric wall thickening of the nondistended urinary bladder which may represent cystitis and a urinalysis was suggested.  We will get that today.  He says he is drinking a lot of fluids but only urinates once or twice daily.  He complains of a sensation of needles in his feet consistent with neuropathy and he has mild fatigue.  His father notes that when he has been helping out with work that he becomes very clammy and sweaty, and likely this is deconditioning in my opinion.  He denies fevers or chills.  He complains of generalized pain everywhere which is normal for him.  His appetite is good.His weight has decreased 4 pounds since June 2022.  I have reviewed the  past medical history, past surgical history, social history and family history with the patient and they are unchanged from previous note.  ALLERGIES:  has No Known Allergies.  MEDICATIONS:  Current Outpatient Medications  Medication Sig Dispense Refill   Rimegepant Sulfate (NURTEC) 75 MG TBDP Take by mouth as directed. As needed for headaches     No current facility-administered medications for this visit.    HISTORY OF PRESENT ILLNESS:  Cadarius Nevares is a 36 y.o. male who was referred by Dr. [name] for lymphadenopathy. He  underwent a CT of abdomen and pelvis on 01/02/2019 for left lower quadrant pain lasting for one year, showing no acute findings identified within abdomen or pelvis. He has a prominent spleen measuring 12.6 cm in length with a volume of 380 cc, and bilateral axillary and inguinal adenopathy, etiology indeterminate.  There is a lesion in the lateral segment of the left lobe of the liver which is compatible with a hemangioma.  He was diagnosed with [facility] Spotted Fever in 2019, for which he had to be treated with two courses of antibiotics.  He underwent a colonoscopy, which returned negative.  He has multiple complaints including myalgias, decreased appetite, headaches, fatigue, and recurrent furuncles. His chemistries did show a mildly elevated bilirubin with a normal direct bilirubin and testing for hepatitis for A, B, and C were negative. UGT1A1 revealed a single copy allele with a heterozygous pattern, confirming Gilbert's syndrome.  I checked him for hemolytic anemia and this was negative.  CT chest from August 12th revealed the axillary lymph nodes are unchanged in size, and these measure up to 1.4 cm on the left.  It also showed an unchanged 4 mm right middle lobe lung nodule, most likely benign.      PET scan revealed hypermetabolic bilateral axillary, chest wall, external iliac chain and inguinal lymph nodes, most indicative of lymphoma.  He underwent a biopsy, and the right axillary lymph node was reactive.  Flow Cytometry was negative for a monoclonal population.  The pathologist and I discussed that this most likely represents infectious mononucleosis, but could also be consistent with autoimmune disease.  I checked for [facility] Spotted Fever, Randell Patient titers, Lyme disease, RA, and ANA.  The [facility] Spotted Fever titer still revealed a 1;64.  The Epstein Bar titers revealed high levels of VCA IgG antibodies.  Review of prior MRI scans reveal a chronic lesion in the deep right  posterior frontal white matter which is probably due to remote trauma, and was present on scans in 2017 and 2019.  We repeated a CT scan of the head due to his persistent headaches and this was negative. CT chest, abdomen and pelvis from April 2021 revealed a stable number of axillary and inguinal lymph nodes, which are not enlarged by CT size criteria.  In fact the axillary nodes have decreased from 10 mm to 7 mm in diameter.  An enlarged pulmonic trunk, indicative of pulmonary arterial hypertension, and hepatic steatosis were also observed.  He underwent a colonoscopy in 2020 with Dr. Melina Copa which was clear.    Oncology History   No history exists.      REVIEW OF SYSTEMS:   Constitutional: Denies fevers, chills or abnormal weight loss Eyes: Denies blurriness of vision Ears, nose, mouth, throat, and face: Denies mucositis or sore throat Respiratory: Denies cough, dyspnea or wheezes Cardiovascular: Denies palpitation, chest discomfort or lower extremity swelling Gastrointestinal:  Denies nausea, heartburn or change in bowel habits  Skin: No rash or lesions Lymphatics: Denies new lymphadenopathy or easy bruising Neurological:Denies numbness, tingling or new weaknesses Behavioral/Psych: Mood is stable, no new changes  All other systems were reviewed with the patient and are negative.   VITALS:  Blood pressure 135/82, pulse 83, temperature 98.2 F (36.8 C), temperature source Oral, resp. rate 17, height 5' 10.75" (1.797 m), weight 275 lb 11.2 oz (125.1 kg), SpO2 97 %.  Wt Readings from Last 3 Encounters:  05/14/22 275 lb 11.2 oz (125.1 kg)  01/05/22 279 lb 1.6 oz (126.6 kg)  05/12/21 267 lb 1.6 oz (121.2 kg)    Body mass index is 38.72 kg/m.  Performance status (ECOG): 1 - Symptomatic but completely ambulatory  PHYSICAL EXAM:   GENERAL:alert, no distress and comfortable SKIN: skin color, texture, turgor are normal,  He has multiple red raised lesions of his extremities which  appear fungal to me EYES: normal, Conjunctiva are pink and non-injected, sclera clear OROPHARYNX:no exudate, no erythema and lips, buccal mucosa, and tongue normal  NECK: supple, thyroid normal size, non-tender, without nodularity LYMPH:  no palpable lymphadenopathy in the cervical, axillary or inguinal LUNGS: clear to auscultation and percussion with normal breathing effort HEART: regular rate & rhythm and no murmurs and no lower extremity edema ABDOMEN:abdomen soft, non-tender and normal bowel sounds Musculoskeletal:no cyanosis of digits and no clubbing  NEURO: alert & oriented x 3 with fluent speech, no focal motor/sensory deficits  LABORATORY DATA:  I have reviewed the data as listed    Component Value Date/Time   NA 138 01/05/2022 0000   K 4.0 01/05/2022 0000   CL 101 01/05/2022 0000   CO2 27 (A) 01/05/2022 0000   BUN 7 01/05/2022 0000   CREATININE 0.7 01/05/2022 0000   CALCIUM 9.2 01/05/2022 0000   ALBUMIN 4.5 01/05/2022 0000   AST 46 (A) 01/05/2022 0000   ALT 51 (A) 01/05/2022 0000   ALKPHOS 75 01/05/2022 0000    No results found for: "SPEP", "UPEP"  Lab Results  Component Value Date   WBC 6.5 01/05/2022   NEUTROABS 4.88 01/05/2022   HGB 14.2 01/05/2022   HCT 41 01/05/2022   PLT 346 01/05/2022      Chemistry      Component Value Date/Time   NA 138 01/05/2022 0000   K 4.0 01/05/2022 0000   CL 101 01/05/2022 0000   CO2 27 (A) 01/05/2022 0000   BUN 7 01/05/2022 0000   CREATININE 0.7 01/05/2022 0000   GLU 128 01/05/2022 0000      Component Value Date/Time   CALCIUM 9.2 01/05/2022 0000   ALKPHOS 75 01/05/2022 0000   AST 46 (A) 01/05/2022 0000   ALT 51 (A) 01/05/2022 0000       RADIOGRAPHIC STUDIES: I have personally reviewed the radiological images as listed and agreed with the findings in the report.

## 2022-05-14 ENCOUNTER — Inpatient Hospital Stay: Payer: Medicare HMO | Attending: Oncology | Admitting: Oncology

## 2022-05-14 ENCOUNTER — Encounter: Payer: Self-pay | Admitting: Oncology

## 2022-05-14 ENCOUNTER — Other Ambulatory Visit: Payer: Self-pay | Admitting: Oncology

## 2022-05-14 VITALS — BP 135/82 | HR 83 | Temp 98.2°F | Resp 17 | Ht 70.75 in | Wt 275.7 lb

## 2022-05-14 DIAGNOSIS — R34 Anuria and oliguria: Secondary | ICD-10-CM

## 2022-05-14 DIAGNOSIS — R760 Raised antibody titer: Secondary | ICD-10-CM

## 2022-05-14 DIAGNOSIS — D649 Anemia, unspecified: Secondary | ICD-10-CM | POA: Diagnosis not present

## 2022-05-14 DIAGNOSIS — K760 Fatty (change of) liver, not elsewhere classified: Secondary | ICD-10-CM | POA: Insufficient documentation

## 2022-05-14 DIAGNOSIS — R590 Localized enlarged lymph nodes: Secondary | ICD-10-CM | POA: Diagnosis not present

## 2022-05-14 DIAGNOSIS — R591 Generalized enlarged lymph nodes: Secondary | ICD-10-CM

## 2022-05-14 DIAGNOSIS — Z79899 Other long term (current) drug therapy: Secondary | ICD-10-CM | POA: Insufficient documentation

## 2022-05-14 DIAGNOSIS — D539 Nutritional anemia, unspecified: Secondary | ICD-10-CM | POA: Insufficient documentation

## 2022-05-14 LAB — URINALYSIS, COMPLETE (UACMP) WITH MICROSCOPIC
Bacteria, UA: NONE SEEN
Bilirubin Urine: NEGATIVE
Glucose, UA: NEGATIVE mg/dL
Hgb urine dipstick: NEGATIVE
Ketones, ur: NEGATIVE mg/dL
Leukocytes,Ua: NEGATIVE
Nitrite: NEGATIVE
Protein, ur: 30 mg/dL — AB
Specific Gravity, Urine: 1.024 (ref 1.005–1.030)
pH: 6 (ref 5.0–8.0)

## 2022-05-14 LAB — VITAMIN B12: Vitamin B-12: 209 pg/mL (ref 180–914)

## 2022-05-14 LAB — FERRITIN: Ferritin: 212 ng/mL (ref 24–336)

## 2022-05-14 LAB — IRON AND TIBC
Iron: 46 ug/dL (ref 45–182)
Saturation Ratios: 11 % — ABNORMAL LOW (ref 17.9–39.5)
TIBC: 417 ug/dL (ref 250–450)
UIBC: 371 ug/dL

## 2022-05-14 LAB — FOLATE: Folate: 7.3 ng/mL (ref >5.9)

## 2022-05-15 ENCOUNTER — Encounter: Payer: Self-pay | Admitting: Oncology

## 2022-05-15 LAB — URINE CULTURE: Culture: NO GROWTH

## 2022-05-22 ENCOUNTER — Telehealth: Payer: Self-pay

## 2022-05-22 NOTE — Telephone Encounter (Signed)
-----   Message from [name], MD sent at 05/15/2022  1:51 PM EST ----- Regarding: call Tell him iron levels are low and B12 just barely normal.  I rec iron 1 po daily and B12 500 po mcg daily

## 2022-05-28 DIAGNOSIS — G8929 Other chronic pain: Secondary | ICD-10-CM | POA: Diagnosis not present

## 2022-05-28 DIAGNOSIS — G629 Polyneuropathy, unspecified: Secondary | ICD-10-CM | POA: Diagnosis not present

## 2022-05-28 DIAGNOSIS — R591 Generalized enlarged lymph nodes: Secondary | ICD-10-CM | POA: Diagnosis not present

## 2022-05-28 DIAGNOSIS — R519 Headache, unspecified: Secondary | ICD-10-CM | POA: Diagnosis not present

## 2022-05-28 DIAGNOSIS — Z6838 Body mass index (BMI) 38.0-38.9, adult: Secondary | ICD-10-CM | POA: Diagnosis not present

## 2022-05-28 DIAGNOSIS — R2681 Unsteadiness on feet: Secondary | ICD-10-CM | POA: Diagnosis not present

## 2022-05-28 DIAGNOSIS — R7989 Other specified abnormal findings of blood chemistry: Secondary | ICD-10-CM | POA: Diagnosis not present

## 2022-05-28 DIAGNOSIS — R030 Elevated blood-pressure reading, without diagnosis of hypertension: Secondary | ICD-10-CM | POA: Diagnosis not present

## 2022-09-05 DIAGNOSIS — R519 Headache, unspecified: Secondary | ICD-10-CM | POA: Diagnosis not present

## 2022-09-05 DIAGNOSIS — R2681 Unsteadiness on feet: Secondary | ICD-10-CM | POA: Diagnosis not present

## 2022-09-05 DIAGNOSIS — G8929 Other chronic pain: Secondary | ICD-10-CM | POA: Diagnosis not present

## 2022-09-05 DIAGNOSIS — R591 Generalized enlarged lymph nodes: Secondary | ICD-10-CM | POA: Diagnosis not present

## 2022-09-05 DIAGNOSIS — R748 Abnormal levels of other serum enzymes: Secondary | ICD-10-CM | POA: Diagnosis not present

## 2022-12-04 DIAGNOSIS — R030 Elevated blood-pressure reading, without diagnosis of hypertension: Secondary | ICD-10-CM | POA: Diagnosis not present

## 2022-12-04 DIAGNOSIS — E785 Hyperlipidemia, unspecified: Secondary | ICD-10-CM | POA: Diagnosis not present

## 2023-05-22 DIAGNOSIS — R110 Nausea: Secondary | ICD-10-CM | POA: Diagnosis not present

## 2023-05-22 DIAGNOSIS — R159 Full incontinence of feces: Secondary | ICD-10-CM | POA: Diagnosis not present

## 2023-05-22 DIAGNOSIS — R109 Unspecified abdominal pain: Secondary | ICD-10-CM | POA: Diagnosis not present

## 2023-05-22 DIAGNOSIS — K219 Gastro-esophageal reflux disease without esophagitis: Secondary | ICD-10-CM | POA: Diagnosis not present

## 2023-06-19 DIAGNOSIS — R519 Headache, unspecified: Secondary | ICD-10-CM | POA: Diagnosis not present

## 2023-06-19 DIAGNOSIS — G8929 Other chronic pain: Secondary | ICD-10-CM | POA: Diagnosis not present

## 2023-06-19 DIAGNOSIS — Z87828 Personal history of other (healed) physical injury and trauma: Secondary | ICD-10-CM | POA: Diagnosis not present

## 2023-06-19 DIAGNOSIS — E785 Hyperlipidemia, unspecified: Secondary | ICD-10-CM | POA: Diagnosis not present

## 2023-06-19 DIAGNOSIS — E66812 Obesity, class 2: Secondary | ICD-10-CM | POA: Diagnosis not present

## 2023-06-19 DIAGNOSIS — R109 Unspecified abdominal pain: Secondary | ICD-10-CM | POA: Diagnosis not present

## 2023-06-19 DIAGNOSIS — R2681 Unsteadiness on feet: Secondary | ICD-10-CM | POA: Diagnosis not present

## 2023-06-19 DIAGNOSIS — I10 Essential (primary) hypertension: Secondary | ICD-10-CM | POA: Diagnosis not present

## 2023-06-19 DIAGNOSIS — Z6837 Body mass index (BMI) 37.0-37.9, adult: Secondary | ICD-10-CM | POA: Diagnosis not present

## 2023-07-17 DIAGNOSIS — R159 Full incontinence of feces: Secondary | ICD-10-CM | POA: Diagnosis not present

## 2023-07-17 DIAGNOSIS — K219 Gastro-esophageal reflux disease without esophagitis: Secondary | ICD-10-CM | POA: Diagnosis not present

## 2023-07-17 DIAGNOSIS — R110 Nausea: Secondary | ICD-10-CM | POA: Diagnosis not present

## 2023-07-17 DIAGNOSIS — R109 Unspecified abdominal pain: Secondary | ICD-10-CM | POA: Diagnosis not present

## 2023-07-18 DIAGNOSIS — R109 Unspecified abdominal pain: Secondary | ICD-10-CM | POA: Diagnosis not present

## 2023-07-18 DIAGNOSIS — R102 Pelvic and perineal pain: Secondary | ICD-10-CM | POA: Diagnosis not present

## 2023-08-14 DIAGNOSIS — R110 Nausea: Secondary | ICD-10-CM | POA: Diagnosis not present

## 2023-08-14 DIAGNOSIS — R935 Abnormal findings on diagnostic imaging of other abdominal regions, including retroperitoneum: Secondary | ICD-10-CM | POA: Diagnosis not present

## 2023-08-14 DIAGNOSIS — R161 Splenomegaly, not elsewhere classified: Secondary | ICD-10-CM | POA: Diagnosis not present

## 2023-08-14 DIAGNOSIS — R945 Abnormal results of liver function studies: Secondary | ICD-10-CM | POA: Diagnosis not present

## 2023-08-14 DIAGNOSIS — K760 Fatty (change of) liver, not elsewhere classified: Secondary | ICD-10-CM | POA: Diagnosis not present

## 2023-08-14 DIAGNOSIS — K219 Gastro-esophageal reflux disease without esophagitis: Secondary | ICD-10-CM | POA: Diagnosis not present

## 2023-08-14 DIAGNOSIS — R109 Unspecified abdominal pain: Secondary | ICD-10-CM | POA: Diagnosis not present

## 2023-08-14 DIAGNOSIS — R159 Full incontinence of feces: Secondary | ICD-10-CM | POA: Diagnosis not present

## 2023-09-25 DIAGNOSIS — K635 Polyp of colon: Secondary | ICD-10-CM | POA: Diagnosis not present

## 2023-09-25 DIAGNOSIS — E669 Obesity, unspecified: Secondary | ICD-10-CM | POA: Diagnosis not present

## 2023-09-25 DIAGNOSIS — K219 Gastro-esophageal reflux disease without esophagitis: Secondary | ICD-10-CM | POA: Diagnosis not present

## 2023-09-25 DIAGNOSIS — R159 Full incontinence of feces: Secondary | ICD-10-CM | POA: Diagnosis not present

## 2023-09-25 DIAGNOSIS — R194 Change in bowel habit: Secondary | ICD-10-CM | POA: Diagnosis not present

## 2023-09-25 DIAGNOSIS — R109 Unspecified abdominal pain: Secondary | ICD-10-CM | POA: Diagnosis not present

## 2023-09-25 DIAGNOSIS — D122 Benign neoplasm of ascending colon: Secondary | ICD-10-CM | POA: Diagnosis not present

## 2023-09-25 DIAGNOSIS — F17220 Nicotine dependence, chewing tobacco, uncomplicated: Secondary | ICD-10-CM | POA: Diagnosis not present

## 2023-09-25 DIAGNOSIS — D126 Benign neoplasm of colon, unspecified: Secondary | ICD-10-CM | POA: Diagnosis not present

## 2023-09-25 DIAGNOSIS — R110 Nausea: Secondary | ICD-10-CM | POA: Diagnosis not present

## 2023-12-18 DIAGNOSIS — R161 Splenomegaly, not elsewhere classified: Secondary | ICD-10-CM | POA: Diagnosis not present

## 2023-12-18 DIAGNOSIS — R109 Unspecified abdominal pain: Secondary | ICD-10-CM | POA: Diagnosis not present

## 2023-12-18 DIAGNOSIS — R519 Headache, unspecified: Secondary | ICD-10-CM | POA: Diagnosis not present

## 2023-12-18 DIAGNOSIS — M2550 Pain in unspecified joint: Secondary | ICD-10-CM | POA: Diagnosis not present

## 2023-12-18 DIAGNOSIS — E66812 Obesity, class 2: Secondary | ICD-10-CM | POA: Diagnosis not present

## 2023-12-18 DIAGNOSIS — K760 Fatty (change of) liver, not elsewhere classified: Secondary | ICD-10-CM | POA: Diagnosis not present

## 2023-12-18 DIAGNOSIS — I10 Essential (primary) hypertension: Secondary | ICD-10-CM | POA: Diagnosis not present

## 2023-12-18 DIAGNOSIS — Z6839 Body mass index (BMI) 39.0-39.9, adult: Secondary | ICD-10-CM | POA: Diagnosis not present

## 2023-12-18 DIAGNOSIS — G8929 Other chronic pain: Secondary | ICD-10-CM | POA: Diagnosis not present

## 2024-03-16 DIAGNOSIS — R6889 Other general symptoms and signs: Secondary | ICD-10-CM | POA: Diagnosis not present

## 2024-03-16 DIAGNOSIS — R109 Unspecified abdominal pain: Secondary | ICD-10-CM | POA: Diagnosis not present

## 2024-03-16 DIAGNOSIS — M25562 Pain in left knee: Secondary | ICD-10-CM | POA: Diagnosis not present

## 2024-03-16 DIAGNOSIS — M2550 Pain in unspecified joint: Secondary | ICD-10-CM | POA: Diagnosis not present

## 2024-03-16 DIAGNOSIS — R768 Other specified abnormal immunological findings in serum: Secondary | ICD-10-CM | POA: Diagnosis not present

## 2024-03-31 ENCOUNTER — Telehealth: Payer: Self-pay | Admitting: Gastroenterology

## 2024-03-31 NOTE — Telephone Encounter (Signed)
 Good afternoon Dr. Wilhelmenia,   I received a call from this patient father stating that we had received a referral from Dr. Larene in regards to possible getting a second opinion on patient Chronic abdominal pain. Patient father stated that his son has also seen a rheumatoid specialist and recommended that he does follow up with a GI specialist. Would you please advise on how to schedule this patient.    Thank you.

## 2024-03-31 NOTE — Telephone Encounter (Signed)
Request received to transfer GI care from outside practice to [facility].  We appreciate the interest in our practice, however at this time due to high demand from patients without established GI providers we cannot accommodate this transfer.  Ability to accommodate future transfer requests may change over time and the patient can contact us again in 6-12 months if still interested in being seen at [facility].      °

## 2024-05-29 DIAGNOSIS — R519 Headache, unspecified: Secondary | ICD-10-CM | POA: Diagnosis not present

## 2024-05-29 DIAGNOSIS — E66812 Obesity, class 2: Secondary | ICD-10-CM | POA: Diagnosis not present

## 2024-05-29 DIAGNOSIS — E785 Hyperlipidemia, unspecified: Secondary | ICD-10-CM | POA: Diagnosis not present

## 2024-05-29 DIAGNOSIS — R109 Unspecified abdominal pain: Secondary | ICD-10-CM | POA: Diagnosis not present

## 2024-05-29 DIAGNOSIS — I10 Essential (primary) hypertension: Secondary | ICD-10-CM | POA: Diagnosis not present

## 2024-05-29 DIAGNOSIS — G8929 Other chronic pain: Secondary | ICD-10-CM | POA: Diagnosis not present

## 2024-05-29 DIAGNOSIS — Z6839 Body mass index (BMI) 39.0-39.9, adult: Secondary | ICD-10-CM | POA: Diagnosis not present

## 2024-06-18 DIAGNOSIS — R17 Unspecified jaundice: Secondary | ICD-10-CM | POA: Diagnosis not present

## 2024-06-18 DIAGNOSIS — R161 Splenomegaly, not elsewhere classified: Secondary | ICD-10-CM | POA: Diagnosis not present
